# Patient Record
Sex: Male | Born: 1955
Health system: Southern US, Community
[De-identification: ages and names within clinical notes are randomized; demographics above are authoritative.]

## PROBLEM LIST (undated history)

## (undated) DIAGNOSIS — I10 Essential (primary) hypertension: Secondary | ICD-10-CM

---

## 1999-08-10 ENCOUNTER — Emergency Department (HOSPITAL_COMMUNITY): Admission: EM | Admit: 1999-08-10 | Discharge: 1999-08-10 | Payer: Self-pay | Admitting: Emergency Medicine

## 1999-08-10 ENCOUNTER — Encounter: Payer: Self-pay | Admitting: Emergency Medicine

## 2000-03-26 ENCOUNTER — Emergency Department (HOSPITAL_COMMUNITY): Admission: EM | Admit: 2000-03-26 | Discharge: 2000-03-26 | Payer: Self-pay | Admitting: *Deleted

## 2002-03-07 ENCOUNTER — Emergency Department (HOSPITAL_COMMUNITY): Admission: EM | Admit: 2002-03-07 | Discharge: 2002-03-07 | Payer: Self-pay | Admitting: Emergency Medicine

## 2003-07-25 ENCOUNTER — Emergency Department (HOSPITAL_COMMUNITY): Admission: AC | Admit: 2003-07-25 | Discharge: 2003-07-26 | Payer: Self-pay

## 2003-11-25 ENCOUNTER — Emergency Department (HOSPITAL_COMMUNITY): Admission: EM | Admit: 2003-11-25 | Discharge: 2003-11-26 | Payer: Self-pay | Admitting: Emergency Medicine

## 2003-11-26 ENCOUNTER — Emergency Department (HOSPITAL_COMMUNITY): Admission: EM | Admit: 2003-11-26 | Discharge: 2003-11-26 | Payer: Self-pay | Admitting: Emergency Medicine

## 2007-03-06 ENCOUNTER — Emergency Department (HOSPITAL_COMMUNITY): Admission: EM | Admit: 2007-03-06 | Discharge: 2007-03-06 | Payer: Self-pay | Admitting: Emergency Medicine

## 2007-03-07 ENCOUNTER — Emergency Department (HOSPITAL_COMMUNITY): Admission: EM | Admit: 2007-03-07 | Discharge: 2007-03-07 | Payer: Self-pay | Admitting: Emergency Medicine

## 2007-03-11 ENCOUNTER — Emergency Department (HOSPITAL_COMMUNITY): Admission: EM | Admit: 2007-03-11 | Discharge: 2007-03-11 | Payer: Self-pay | Admitting: Emergency Medicine

## 2007-03-30 ENCOUNTER — Emergency Department (HOSPITAL_COMMUNITY): Admission: EM | Admit: 2007-03-30 | Discharge: 2007-03-30 | Payer: Self-pay | Admitting: Emergency Medicine

## 2007-08-31 ENCOUNTER — Emergency Department (HOSPITAL_COMMUNITY): Admission: EM | Admit: 2007-08-31 | Discharge: 2007-08-31 | Payer: Self-pay | Admitting: Emergency Medicine

## 2007-12-07 ENCOUNTER — Emergency Department (HOSPITAL_COMMUNITY): Admission: EM | Admit: 2007-12-07 | Discharge: 2007-12-07 | Payer: Self-pay | Admitting: Emergency Medicine

## 2007-12-27 ENCOUNTER — Emergency Department (HOSPITAL_COMMUNITY): Admission: EM | Admit: 2007-12-27 | Discharge: 2007-12-27 | Payer: Self-pay | Admitting: Emergency Medicine

## 2007-12-30 ENCOUNTER — Emergency Department (HOSPITAL_COMMUNITY): Admission: EM | Admit: 2007-12-30 | Discharge: 2007-12-30 | Payer: Self-pay | Admitting: Emergency Medicine

## 2010-11-20 LAB — URINALYSIS, ROUTINE W REFLEX MICROSCOPIC
Bilirubin Urine: NEGATIVE
Hgb urine dipstick: NEGATIVE
Nitrite: NEGATIVE
Protein, ur: NEGATIVE
pH: 5.5

## 2010-11-20 LAB — COMPREHENSIVE METABOLIC PANEL
AST: 43 — ABNORMAL HIGH
CO2: 23
Calcium: 9.3
Chloride: 101
Creatinine, Ser: 0.97
Glucose, Bld: 107 — ABNORMAL HIGH
Potassium: 3.5
Total Protein: 7.3

## 2010-11-20 LAB — DIFFERENTIAL
Eosinophils Absolute: 0
Eosinophils Relative: 0
Lymphocytes Relative: 10 — ABNORMAL LOW
Lymphs Abs: 0.9
Monocytes Relative: 8
Neutro Abs: 7

## 2010-11-20 LAB — CBC
HCT: 43
Hemoglobin: 14.5
RBC: 4.57

## 2010-11-20 LAB — ETHANOL: Alcohol, Ethyl (B): <5

## 2010-11-20 LAB — PROTIME-INR: Prothrombin Time: 12.6

## 2014-07-21 ENCOUNTER — Emergency Department (HOSPITAL_COMMUNITY)
Admission: EM | Admit: 2014-07-21 | Discharge: 2014-07-22 | Disposition: A | Discharge status: Home / Self Care | Payer: Self-pay | Attending: Emergency Medicine | Admitting: Emergency Medicine

## 2014-07-21 ENCOUNTER — Emergency Department (HOSPITAL_COMMUNITY): Payer: Self-pay

## 2014-07-21 ENCOUNTER — Encounter (HOSPITAL_COMMUNITY): Payer: Self-pay | Admitting: Emergency Medicine

## 2014-07-21 DIAGNOSIS — Y998 Other external cause status: Secondary | ICD-10-CM | POA: Insufficient documentation

## 2014-07-21 DIAGNOSIS — S0990XA Unspecified injury of head, initial encounter: Secondary | ICD-10-CM | POA: Insufficient documentation

## 2014-07-21 DIAGNOSIS — S99911A Unspecified injury of right ankle, initial encounter: Secondary | ICD-10-CM | POA: Insufficient documentation

## 2014-07-21 DIAGNOSIS — Y9389 Activity, other specified: Secondary | ICD-10-CM | POA: Insufficient documentation

## 2014-07-21 DIAGNOSIS — Y9289 Other specified places as the place of occurrence of the external cause: Secondary | ICD-10-CM | POA: Insufficient documentation

## 2014-07-21 DIAGNOSIS — Z720 Tobacco use: Secondary | ICD-10-CM | POA: Insufficient documentation
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
## 2014-07-21 NOTE — ED Notes (Signed)
Returned from CT, [facility] remain at bedside
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
## 2014-07-21 NOTE — ED Notes (Signed)
Bed: WA16 Expected date:  Expected time:  Means of arrival:  Comments: [employer] 

## 2014-07-21 NOTE — ED Notes (Signed)
MD at bedside. 

## 2014-07-22 NOTE — ED Notes (Signed)
Patient laying on stretcher, talking on cell phone.

## 2014-07-22 NOTE — ED Notes (Signed)
Patient ambulatory to bathroom with no assistance.

## 2014-07-22 NOTE — Discharge Instructions (Signed)
Assault, General °Assault includes any behavior, whether intentional or reckless, which results in bodily injury to another person and/or damage to property. Included in this would be any behavior, intentional or reckless, that by its nature would be understood (interpreted) by a reasonable person as intent to harm another person or to damage his/her property. Threats may be oral or written. They may be communicated through regular mail, computer, fax, or phone. These threats may be direct or implied. °FORMS OF ASSAULT INCLUDE: °· Physically assaulting a person. This includes physical threats to inflict physical harm as well as: °· Slapping. °· Hitting. °· Poking. °· Kicking. °· Punching. °· Pushing. °· Arson. °· Sabotage. °· Equipment vandalism. °· Damaging or destroying property. °· Throwing or hitting objects. °· Displaying a weapon or an object that appears to be a weapon in a threatening manner. °· Carrying a firearm of any kind. °· Using a weapon to harm someone. °· Using greater physical size/strength to intimidate another. °· Making intimidating or threatening gestures. °· Bullying. °· Hazing. °· Intimidating, threatening, hostile, or abusive language directed toward another person. °· It communicates the intention to engage in violence against that person. And it leads a reasonable person to expect that violent behavior may occur. °· Stalking another person. °IF IT HAPPENS AGAIN: °· Immediately call for emergency help (911 in [place] °· If someone poses clear and immediate danger to you, seek legal authorities to have a protective or restraining order put in place. °· Less threatening assaults can at least be reported to authorities. °STEPS TO TAKE IF A SEXUAL ASSAULT HAS HAPPENED °· Go to an area of safety. This may include a shelter or staying with a friend. Stay away from the area where you have been attacked. A large percentage of sexual assaults are caused by a friend, relative or associate. °· If  medications were given by your caregiver, take them as directed for the full length of time prescribed. °· Only take over-the-counter or prescription medicines for pain, discomfort, or fever as directed by your caregiver. °· If you have come in contact with a sexual disease, find out if you are to be tested again. If your caregiver is concerned about the HIV/AIDS virus, he/she may require you to have continued testing for several months. °· For the protection of your privacy, test results can not be given over the phone. Make sure you receive the results of your test. If your test results are not back during your visit, make an appointment with your caregiver to find out the results. Do not assume everything is normal if you have not heard from your caregiver or the medical facility. It is important for you to follow up on all of your test results. °· File appropriate papers with authorities. This is important in all assaults, even if it has occurred in a family or by a friend. °SEEK MEDICAL CARE IF: °· You have new problems because of your injuries. °· You have problems that may be because of the medicine you are taking, such as: °· Rash. °· Itching. °· Swelling. °· Trouble breathing. °· You develop belly (abdominal) pain, feel sick to your stomach (nausea) or are vomiting. °· You begin to run a temperature. °· You need supportive care or referral to a rape crisis center. These are centers with trained personnel who can help you get through this ordeal. °SEEK IMMEDIATE MEDICAL CARE IF: °· You are afraid of being threatened, beaten, or abused. In [place] call 911. °· You   receive new injuries related to abuse. °· You develop severe pain in any area injured in the assault or have any change in your condition that concerns you. °· You faint or lose consciousness. °· You develop chest pain or shortness of breath. °Document Released: 02/05/2005 Document Revised: 04/30/2011 Document Reviewed: 09/24/2007 °ExitCare® Patient  Information ©2015 [employer]. This information is not intended to replace advice given to you by your health care provider. Make sure you discuss any questions you have with your health care provider. ° °Head Injury °You have received a head injury. It does not appear serious at this time. Headaches and vomiting are common following head injury. It should be easy to awaken from sleeping. Sometimes it is necessary for you to stay in the emergency department for a while for observation. Sometimes admission to the hospital may be needed. After injuries such as yours, most problems occur within the first 24 hours, but side effects may occur up to 7-10 days after the injury. It is important for you to carefully monitor your condition and contact your health care provider or seek immediate medical care if there is a change in your condition. °WHAT ARE THE TYPES OF HEAD INJURIES? °Head injuries can be as minor as a bump. Some head injuries can be more severe. More severe head injuries include: °· A jarring injury to the brain (concussion). °· A bruise of the brain (contusion). This mean there is bleeding in the brain that can cause swelling. °· A cracked skull (skull fracture). °· Bleeding in the brain that collects, clots, and forms a bump (hematoma). °WHAT CAUSES A HEAD INJURY? °A serious head injury is most likely to happen to someone who is in a car wreck and is not wearing a seat belt. Other causes of major head injuries include bicycle or motorcycle accidents, sports injuries, and falls. °HOW ARE HEAD INJURIES DIAGNOSED? °A complete history of the event leading to the injury and your current symptoms will be helpful in diagnosing head injuries. Many times, pictures of the brain, such as CT or MRI are needed to see the extent of the injury. Often, an overnight hospital stay is necessary for observation.  °WHEN SHOULD I SEEK IMMEDIATE MEDICAL CARE?  °You should get help right away if: °· You have confusion or  drowsiness. °· You feel sick to your stomach (nauseous) or have continued, forceful vomiting. °· You have dizziness or unsteadiness that is getting worse. °· You have severe, continued headaches not relieved by medicine. Only take over-the-counter or prescription medicines for pain, fever, or discomfort as directed by your health care provider. °· You do not have normal function of the arms or legs or are unable to walk. °· You notice changes in the black spots in the center of the colored part of your eye (pupil). °· You have a clear or bloody fluid coming from your nose or ears. °· You have a loss of vision. °During the next 24 hours after the injury, you must stay with someone who can watch you for the warning signs. This person should contact local emergency services (911 in the [place] if you have seizures, you become unconscious, or you are unable to wake up. °HOW CAN I PREVENT A HEAD INJURY IN THE FUTURE? °The most important factor for preventing major head injuries is avoiding motor vehicle accidents.  To minimize the potential for damage to your head, it is crucial to wear seat belts while riding in motor vehicles. Wearing helmets while bike riding   and playing collision sports (like football) is also helpful. Also, avoiding dangerous activities around the house will further help reduce your risk of head injury.  °WHEN CAN I RETURN TO NORMAL ACTIVITIES AND ATHLETICS? °You should be reevaluated by your health care provider before returning to these activities. If you have any of the following symptoms, you should not return to activities or contact sports until 1 week after the symptoms have stopped: °· Persistent headache. °· Dizziness or vertigo. °· Poor attention and concentration. °· Confusion. °· Memory problems. °· Nausea or vomiting. °· Fatigue or tire easily. °· Irritability. °· Intolerant of bright lights or loud noises. °· Anxiety or depression. °· Disturbed sleep. °MAKE SURE YOU:  °· Understand these  instructions. °· Will watch your condition. °· Will get help right away if you are not doing well or get worse. °Document Released: 02/05/2005 Document Revised: 02/10/2013 Document Reviewed: 10/13/2012 °ExitCare® Patient Information ©2015 [employer]. This information is not intended to replace advice given to you by your health care provider. Make sure you discuss any questions you have with your health care provider. ° °

## 2014-07-22 NOTE — ED Notes (Signed)
MD at bedside. 

## 2014-08-22 ENCOUNTER — Emergency Department (HOSPITAL_COMMUNITY)
Admission: EM | Admit: 2014-08-22 | Discharge: 2014-08-23 | Payer: Self-pay | Attending: Emergency Medicine | Admitting: Emergency Medicine

## 2014-08-22 ENCOUNTER — Encounter (HOSPITAL_COMMUNITY): Payer: Self-pay | Admitting: Oncology

## 2014-08-22 DIAGNOSIS — R410 Disorientation, unspecified: Secondary | ICD-10-CM | POA: Insufficient documentation

## 2014-08-22 DIAGNOSIS — R600 Localized edema: Secondary | ICD-10-CM | POA: Insufficient documentation

## 2014-08-22 DIAGNOSIS — Z720 Tobacco use: Secondary | ICD-10-CM | POA: Insufficient documentation

## 2014-08-22 NOTE — ED Notes (Signed)
Per paperwork from jail pt needs to be seen d/t edema, confusion and jaundice.

## 2014-08-22 NOTE — ED Provider Notes (Signed)
CSN: [id number]     Arrival date & time 08/22/14  2322 History   First MD Initiated Contact with Patient 08/22/14 2324     Chief Complaint  Patient presents with  . Jaundice    edema, confusion     (Consider location/radiation/quality/duration/timing/severity/associated sxs/prior Treatment) HPI Comments: Patient with past history of alcoholism presents from jail with complaints of new lower extremity edema, confusion, jaundice. History provided by paperwork sent with patient. Patient completed an alcohol detox protocol in early June. No other background given. Patient currently denies medical complaints. He denies chest pain or shortness of breath. No fever, nausea or vomiting. No abdominal pain or diarrhea. The onset of this condition was acute. The course is constant. Aggravating factors: none. Alleviating factors: none.    The history is provided by the patient and medical records.    History reviewed. No pertinent past medical history. History reviewed. No pertinent past surgical history. No family history on file. History  Substance Use Topics  . Smoking status: Current Every Day Smoker -- 0.50 packs/day    Types: Cigarettes  . Smokeless tobacco: Not on file  . Alcohol Use: Yes    Review of Systems  Constitutional: Negative for fever.  HENT: Negative for rhinorrhea and sore throat.   Eyes: Negative for redness.  Respiratory: Negative for cough.   Cardiovascular: Positive for leg swelling. Negative for chest pain.  Gastrointestinal: Negative for nausea, vomiting, abdominal pain and diarrhea.  Genitourinary: Negative for dysuria.  Musculoskeletal: Negative for myalgias.  Skin: Positive for color change. Negative for rash.  Neurological: Negative for headaches.  Psychiatric/Behavioral: Positive for confusion (reported, patient currently appropriate).      Allergies  Review of patient's allergies indicates no known allergies.  Home Medications   Prior to Admission  medications   Not on File   BP 162/92 mmHg  Pulse 86  Temp(Src) 98.7 F (37.1 C) (Oral)  Resp 16  Ht  (1.778 m)  Wt 150 lb (68.04 kg)  BMI 21.52 kg/m2  SpO2 99%   Physical Exam  Constitutional: He appears well-developed and well-nourished.  HENT:  Head: Normocephalic and atraumatic.  Mouth/Throat: Mucous membranes are normal. Mucous membranes are not dry.  Eyes: Conjunctivae are normal. Right eye exhibits no discharge. Left eye exhibits no discharge.  Neck: Trachea normal and normal range of motion. Neck supple. Normal carotid pulses and no JVD present. No muscular tenderness present. Carotid bruit is not present. No tracheal deviation present.  Cardiovascular: Normal rate, regular rhythm, S1 normal, S2 normal, normal heart sounds and intact distal pulses.  Exam reveals no distant heart sounds and no decreased pulses.   No murmur heard. Pulmonary/Chest: Effort normal and breath sounds normal. No respiratory distress. He has no wheezes. He exhibits no tenderness.  Abdominal: Soft. Normal aorta and bowel sounds are normal. There is no tenderness. There is no rebound and no guarding.  Musculoskeletal: He exhibits edema and tenderness.  2+ pitting edema to knees bilaterally.   Neurological: He is alert.  Skin: Skin is warm and dry. He is not diaphoretic. No cyanosis. No pallor.  Psychiatric: He has a normal mood and affect.  Nursing note and vitals reviewed.   ED Course  Procedures (including critical care time) Labs Review Labs Reviewed  CBC WITH DIFFERENTIAL/PLATELET - Abnormal; Notable for the following:    RBC 4.18 (*)    Hemoglobin 12.4 (*)    HCT 37.6 (*)    All other components within normal limits  BASIC METABOLIC  PANEL - Abnormal; Notable for the following:    Glucose, Bld 107 (*)    All other components within normal limits  HEPATIC FUNCTION PANEL  AMMONIA  PROTIME-INR  URINALYSIS, DIPSTICK ONLY    Imaging Review No results found.   EKG  Interpretation   Date/Time:  Monday August 23 2014 00:19:08 EDT Ventricular Rate:  77 PR Interval:  168 QRS Duration: 93 QT Interval:  427 QTC Calculation: 483 R Axis:   9 Text Interpretation:  Sinus rhythm Confirmed by [facility]-RASCH  MD, APRIL  ([id number]) on 08/23/2014 12:35:55 AM      11:51 PM Patient seen and examined. Work-up initiated. Medications ordered.   Vital signs reviewed and are as follows: BP 162/92 mmHg  Pulse 86  Temp(Src) 98.7 F (37.1 C) (Oral)  Resp 16  Ht 5\' 10"  (1.778 m)  Wt 150 lb (68.04 kg)  BMI 21.52 kg/m2  SpO2 99%  1:10 AM Patient labs reviewed with Dr. Nicanor AlconPalumbo. Patient is not jaundiced. No signs of hepatic encephalopathy. EKG normal. No indications for admission. Will discharge.   Patient urged to return with worsening symptoms or other concerns. Patient verbalized understanding and agrees with plan.    MDM   Final diagnoses:  Bilateral edema of lower extremity   Patient from jail with 3 complaints, limited history sent with patient:   Lower extremity edema: No signs of CHF, liver failure, kidney dysfunction/nephrotic syndrome  Altered mentation: alcohol detox completed 3-4 weeks ago and do not suspect DTs, patient is alert and properly oriented here, normal ammonia. No signs of stroke, moves all extremities.   Jaundice: normal bili  Vitals and patient exam stable during ED stay.   No dangerous or life-threatening conditions suspected or identified by history, physical exam, and by work-up. No indications for hospitalization identified.      Renne CriglerJoshua Elecia Serafin, PA-C 08/23/14 0117  April Palumbo, MD 08/23/14 [phone]

## 2014-08-23 LAB — CBC WITH DIFFERENTIAL/PLATELET
BASOS ABS: 0 10*3/uL (ref 0.0–0.1)
BASOS PCT: 0 % (ref 0–1)
Eosinophils Absolute: 0.3 10*3/uL (ref 0.0–0.7)
Eosinophils Relative: 5 % (ref 0–5)
HEMATOCRIT: 37.6 % — AB (ref 39.0–52.0)
HEMOGLOBIN: 12.4 g/dL — AB (ref 13.0–17.0)
LYMPHS PCT: 39 % (ref 12–46)
Lymphs Abs: 2.4 10*3/uL (ref 0.7–4.0)
MCH: 29.7 pg (ref 26.0–34.0)
MCHC: 33 g/dL (ref 30.0–36.0)
MCV: 90 fL (ref 78.0–100.0)
Monocytes Absolute: 0.7 10*3/uL (ref 0.1–1.0)
Monocytes Relative: 11 % (ref 3–12)
NEUTROS ABS: 2.8 10*3/uL (ref 1.7–7.7)
Neutrophils Relative %: 45 % (ref 43–77)
PLATELETS: 300 10*3/uL (ref 150–400)
RBC: 4.18 MIL/uL — ABNORMAL LOW (ref 4.22–5.81)
RDW: 12 % (ref 11.5–15.5)
WBC: 6.3 10*3/uL (ref 4.0–10.5)

## 2014-08-23 LAB — HEPATIC FUNCTION PANEL
ALK PHOS: 51 U/L (ref 38–126)
ALT: 24 U/L (ref 17–63)
AST: 26 U/L (ref 15–41)
Albumin: 4.2 g/dL (ref 3.5–5.0)
BILIRUBIN DIRECT: 0.1 mg/dL (ref 0.1–0.5)
BILIRUBIN INDIRECT: 0.4 mg/dL (ref 0.3–0.9)
BILIRUBIN TOTAL: 0.5 mg/dL (ref 0.3–1.2)
Total Protein: 8.1 g/dL (ref 6.5–8.1)

## 2014-08-23 LAB — BASIC METABOLIC PANEL
Anion gap: 8 (ref 5–15)
BUN: 14 mg/dL (ref 6–20)
CHLORIDE: 106 mmol/L (ref 101–111)
CO2: 29 mmol/L (ref 22–32)
CREATININE: 0.97 mg/dL (ref 0.61–1.24)
Calcium: 10 mg/dL (ref 8.9–10.3)
GLUCOSE: 107 mg/dL — AB (ref 65–99)
POTASSIUM: 3.9 mmol/L (ref 3.5–5.1)
SODIUM: 143 mmol/L (ref 135–145)

## 2014-08-23 LAB — AMMONIA: AMMONIA: 27 umol/L (ref 9–35)

## 2014-08-23 LAB — PROTIME-INR
INR: 1.03 (ref 0.00–1.49)
Prothrombin Time: 13.7 seconds (ref 11.6–15.2)

## 2014-08-23 NOTE — ED Notes (Signed)
EKG given to EDP Palumbo for review 

## 2014-08-23 NOTE — Discharge Instructions (Signed)
Please read and follow all provided instructions.  Your diagnoses today include:  1. Bilateral edema of lower extremity     Tests performed today include:  Blood counts and electrolytes - normal  Liver function - normal  Ammonia - normal  EKG - normal  Vital signs. See below for your results today.   Medications prescribed:   None  Take any prescribed medications only as directed.  Home care instructions:  Follow any educational materials contained in this packet.  BE VERY CAREFUL not to take multiple medicines containing Tylenol (also called acetaminophen). Doing so can lead to an overdose which can damage your liver and cause liver failure and possibly death.   Follow-up instructions: Please follow-up with your primary care provider in the next 3 days for further evaluation of your symptoms.   Return instructions:   Please return to the Emergency Department if you experience worsening symptoms.   Please return if you have any other emergent concerns.  Additional Information:  Your vital signs today were: BP 171/91 mmHg   Pulse 78   Temp(Src) 98.7 F (37.1 C) (Oral)   Resp 14   Ht  (1.778 m)   Wt 150 lb (68.04 kg)   BMI 21.52 kg/m2   SpO2 100% If your blood pressure (BP) was elevated above 135/85 this visit, please have this repeated by your doctor within one month. --------------

## 2015-10-11 ENCOUNTER — Emergency Department (HOSPITAL_COMMUNITY)
Admission: EM | Admit: 2015-10-11 | Discharge: 2015-10-11 | Discharge status: Against Medical Advice | Payer: Self-pay | Attending: Emergency Medicine | Admitting: Emergency Medicine

## 2015-10-11 ENCOUNTER — Ambulatory Visit (HOSPITAL_COMMUNITY): Admission: RE | Admit: 2015-10-11 | Payer: Self-pay | Source: Ambulatory Visit

## 2015-10-11 ENCOUNTER — Encounter (HOSPITAL_COMMUNITY): Payer: Self-pay | Admitting: *Deleted

## 2015-10-11 ENCOUNTER — Emergency Department (HOSPITAL_COMMUNITY): Payer: Self-pay

## 2015-10-11 ENCOUNTER — Encounter (HOSPITAL_COMMUNITY): Payer: Self-pay

## 2015-10-11 ENCOUNTER — Emergency Department (HOSPITAL_COMMUNITY)
Admission: EM | Admit: 2015-10-11 | Discharge: 2015-10-11 | Disposition: A | Discharge status: Home / Self Care | Payer: Self-pay | Attending: Dermatology | Admitting: Dermatology

## 2015-10-11 DIAGNOSIS — Y939 Activity, unspecified: Secondary | ICD-10-CM | POA: Insufficient documentation

## 2015-10-11 DIAGNOSIS — Y999 Unspecified external cause status: Secondary | ICD-10-CM | POA: Insufficient documentation

## 2015-10-11 DIAGNOSIS — S0191XA Laceration without foreign body of unspecified part of head, initial encounter: Secondary | ICD-10-CM

## 2015-10-11 DIAGNOSIS — R220 Localized swelling, mass and lump, head: Secondary | ICD-10-CM | POA: Insufficient documentation

## 2015-10-11 DIAGNOSIS — S060X1A Concussion with loss of consciousness of 30 minutes or less, initial encounter: Secondary | ICD-10-CM | POA: Insufficient documentation

## 2015-10-11 DIAGNOSIS — S0990XA Unspecified injury of head, initial encounter: Secondary | ICD-10-CM

## 2015-10-11 DIAGNOSIS — F17210 Nicotine dependence, cigarettes, uncomplicated: Secondary | ICD-10-CM | POA: Insufficient documentation

## 2015-10-11 DIAGNOSIS — R0781 Pleurodynia: Secondary | ICD-10-CM | POA: Insufficient documentation

## 2015-10-11 DIAGNOSIS — I10 Essential (primary) hypertension: Secondary | ICD-10-CM | POA: Insufficient documentation

## 2015-10-11 DIAGNOSIS — Z5321 Procedure and treatment not carried out due to patient leaving prior to being seen by health care provider: Secondary | ICD-10-CM | POA: Insufficient documentation

## 2015-10-11 DIAGNOSIS — Y929 Unspecified place or not applicable: Secondary | ICD-10-CM | POA: Insufficient documentation

## 2015-10-11 DIAGNOSIS — S0181XA Laceration without foreign body of other part of head, initial encounter: Secondary | ICD-10-CM | POA: Insufficient documentation

## 2015-10-11 HISTORY — DX: Essential (primary) hypertension: I10

## 2015-10-11 MED ORDER — LIDOCAINE HCL (PF) 1 % IJ SOLN
INTRAMUSCULAR | Status: AC
  Filled 2015-10-11: qty 30

## 2015-10-11 MED ORDER — LIDOCAINE-EPINEPHRINE 1 %-1:100000 IJ SOLN: 10.0000 mL | Freq: Once | INTRAMUSCULAR | Status: AC

## 2015-10-11 MED ORDER — LIDOCAINE-EPINEPHRINE 2 %-1:100000 IJ SOLN
INTRAMUSCULAR | Status: AC
Start: 2015-10-11 — End: 2015-10-11
  Administered 2015-10-11: 04:00:00
  Filled 2015-10-11: qty 1

## 2015-10-11 MED ORDER — TETANUS-DIPHTH-ACELL PERTUSSIS 5-2.5-18.5 LF-MCG/0.5 IM SUSP
0.5000 mL | Freq: Once | INTRAMUSCULAR | Status: DC
  Filled 2015-10-11: qty 0.5

## 2015-10-11 NOTE — ED Triage Notes (Signed)
Pt admits to drinking two 40oz beers tonight.
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
## 2015-10-11 NOTE — ED Notes (Signed)
Pt refused TDAP vaccine  tdap returned cabinet    Lidocaine with epi used by provider unable to scan was thrown away after use  Lidocaine 1percent returned to cabinet   Pt left without D/c paper work and refused CT of head
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
## 2015-10-11 NOTE — ED Notes (Signed)
Pt complaining of some L lower rib pain.
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
## 2016-07-10 ENCOUNTER — Emergency Department (HOSPITAL_COMMUNITY)
Admission: EM | Admit: 2016-07-10 | Discharge: 2016-07-10 | Disposition: A | Discharge status: Home / Self Care | Payer: Self-pay | Attending: Emergency Medicine | Admitting: Emergency Medicine

## 2016-07-10 ENCOUNTER — Encounter (HOSPITAL_COMMUNITY): Payer: Self-pay

## 2016-07-10 ENCOUNTER — Emergency Department (HOSPITAL_COMMUNITY): Payer: Self-pay

## 2016-07-10 DIAGNOSIS — F17210 Nicotine dependence, cigarettes, uncomplicated: Secondary | ICD-10-CM | POA: Insufficient documentation

## 2016-07-10 DIAGNOSIS — S0230XA Fracture of orbital floor, unspecified side, initial encounter for closed fracture: Secondary | ICD-10-CM

## 2016-07-10 DIAGNOSIS — H11422 Conjunctival edema, left eye: Secondary | ICD-10-CM

## 2016-07-10 DIAGNOSIS — S0232XA Fracture of orbital floor, left side, initial encounter for closed fracture: Secondary | ICD-10-CM | POA: Insufficient documentation

## 2016-07-10 DIAGNOSIS — H35102 Retinopathy of prematurity, unspecified, left eye: Secondary | ICD-10-CM | POA: Insufficient documentation

## 2016-07-10 DIAGNOSIS — Y939 Activity, unspecified: Secondary | ICD-10-CM | POA: Insufficient documentation

## 2016-07-10 DIAGNOSIS — R931 Abnormal findings on diagnostic imaging of heart and coronary circulation: Secondary | ICD-10-CM | POA: Insufficient documentation

## 2016-07-10 DIAGNOSIS — S058X2A Other injuries of left eye and orbit, initial encounter: Secondary | ICD-10-CM

## 2016-07-10 DIAGNOSIS — Y999 Unspecified external cause status: Secondary | ICD-10-CM | POA: Insufficient documentation

## 2016-07-10 DIAGNOSIS — S0083XA Contusion of other part of head, initial encounter: Secondary | ICD-10-CM | POA: Insufficient documentation

## 2016-07-10 DIAGNOSIS — Y929 Unspecified place or not applicable: Secondary | ICD-10-CM | POA: Insufficient documentation

## 2016-07-10 DIAGNOSIS — H11429 Conjunctival edema, unspecified eye: Secondary | ICD-10-CM | POA: Insufficient documentation

## 2016-07-10 DIAGNOSIS — T07XXXA Unspecified multiple injuries, initial encounter: Secondary | ICD-10-CM

## 2016-07-10 DIAGNOSIS — H3500 Unspecified background retinopathy: Secondary | ICD-10-CM

## 2016-07-10 DIAGNOSIS — R935 Abnormal findings on diagnostic imaging of other abdominal regions, including retroperitoneum: Secondary | ICD-10-CM | POA: Insufficient documentation

## 2016-07-10 LAB — I-STAT CHEM 8, ED
BUN: 12 mg/dL (ref 6–20)
CHLORIDE: 107 mmol/L (ref 101–111)
Calcium, Ion: 1.2 mmol/L (ref 1.15–1.40)
Creatinine, Ser: 1.4 mg/dL — ABNORMAL HIGH (ref 0.61–1.24)
GLUCOSE: 114 mg/dL — AB (ref 65–99)
HCT: 47 % (ref 39.0–52.0)
Hemoglobin: 16 g/dL (ref 13.0–17.0)
POTASSIUM: 4.1 mmol/L (ref 3.5–5.1)
Sodium: 138 mmol/L (ref 135–145)
TCO2: 21 mmol/L (ref 0–100)

## 2016-07-10 LAB — CBC WITH DIFFERENTIAL/PLATELET
BASOS ABS: 0 10*3/uL (ref 0.0–0.1)
Basophils Relative: 0 %
Eosinophils Absolute: 0 10*3/uL (ref 0.0–0.7)
Eosinophils Relative: 0 %
HEMATOCRIT: 43.6 % (ref 39.0–52.0)
Hemoglobin: 15 g/dL (ref 13.0–17.0)
Lymphocytes Relative: 51 %
Lymphs Abs: 3.7 10*3/uL (ref 0.7–4.0)
MCH: 31.8 pg (ref 26.0–34.0)
MCHC: 34.4 g/dL (ref 30.0–36.0)
MCV: 92.6 fL (ref 78.0–100.0)
MONO ABS: 0.4 10*3/uL (ref 0.1–1.0)
Monocytes Relative: 5 %
NEUTROS ABS: 3.2 10*3/uL (ref 1.7–7.7)
Neutrophils Relative %: 44 %
Platelets: 290 10*3/uL (ref 150–400)
RBC: 4.71 MIL/uL (ref 4.22–5.81)
RDW: 12.4 % (ref 11.5–15.5)
WBC: 7.3 10*3/uL (ref 4.0–10.5)

## 2016-07-10 MED ORDER — FENTANYL CITRATE (PF) 100 MCG/2ML IJ SOLN
INTRAMUSCULAR | Status: AC
  Administered 2016-07-10: 100 ug
  Filled 2016-07-10: qty 2

## 2016-07-10 MED ORDER — METHOCARBAMOL 500 MG PO TABS
1000.0000 mg | ORAL_TABLET | Freq: Once | ORAL | Status: AC
  Administered 2016-07-10: 1000 mg via ORAL
  Filled 2016-07-10: qty 2

## 2016-07-10 MED ORDER — FENTANYL CITRATE (PF) 100 MCG/2ML IJ SOLN
100.0000 ug | Freq: Once | INTRAMUSCULAR | Status: AC
  Administered 2016-07-10: 100 ug via INTRAVENOUS
  Filled 2016-07-10: qty 2

## 2016-07-10 MED ORDER — TOBRAMYCIN 0.3 % OP OINT: 1.0000 "application " | TOPICAL_OINTMENT | Freq: Three times a day (TID) | OPHTHALMIC | 0 refills | Status: DC

## 2016-07-10 MED ORDER — LIDOCAINE HCL 2 % IJ SOLN
INTRAMUSCULAR | Status: AC
  Filled 2016-07-10: qty 20

## 2016-07-10 MED ORDER — CLINDAMYCIN PHOSPHATE 600 MG/50ML IV SOLN
600.0000 mg | Freq: Once | INTRAVENOUS | Status: AC
  Administered 2016-07-10: 600 mg via INTRAVENOUS
  Filled 2016-07-10: qty 50

## 2016-07-10 MED ORDER — OXYCODONE-ACETAMINOPHEN 5-325 MG PO TABS
1.0000 | ORAL_TABLET | Freq: Once | ORAL | Status: AC
  Administered 2016-07-10: 1 via ORAL
  Filled 2016-07-10: qty 1

## 2016-07-10 MED ORDER — CLINDAMYCIN HCL 300 MG PO CAPS: 300.0000 mg | ORAL_CAPSULE | Freq: Four times a day (QID) | ORAL | 0 refills | Status: DC

## 2016-07-10 MED ORDER — OXYCODONE-ACETAMINOPHEN 5-325 MG PO TABS: 1.0000 | ORAL_TABLET | Freq: Four times a day (QID) | ORAL | 0 refills | Status: DC | PRN

## 2016-07-10 MED ORDER — SODIUM CHLORIDE 0.9 % IV BOLUS (SEPSIS)
1000.0000 mL | Freq: Once | INTRAVENOUS | Status: AC
  Administered 2016-07-10: 1000 mL via INTRAVENOUS

## 2016-07-10 MED ORDER — IOPAMIDOL (ISOVUE-300) INJECTION 61%
100.0000 mL | Freq: Once | INTRAVENOUS | Status: AC | PRN
  Administered 2016-07-10: 100 mL via INTRAVENOUS

## 2016-07-10 MED ORDER — KETOROLAC TROMETHAMINE 30 MG/ML IJ SOLN
30.0000 mg | Freq: Once | INTRAMUSCULAR | Status: AC
  Administered 2016-07-10: 30 mg via INTRAVENOUS
  Filled 2016-07-10: qty 1

## 2016-07-10 MED ORDER — IOPAMIDOL (ISOVUE-300) INJECTION 61%
INTRAVENOUS | Status: AC
  Administered 2016-07-10: 100 mL via INTRAVENOUS
  Filled 2016-07-10: qty 100
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
## 2016-07-10 NOTE — ED Provider Notes (Signed)
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 products present within the left maxillary sinus. Scattered mucosal thickening within the ethmoidal air cells, greater on the left. Polypoid mucosal thickening noted within the inferior right maxillary sinus. Soft tissues: Multifocal left periorbital and facial contusion. CT CERVICAL SPINE FINDINGS Alignment: Study limited by positioning. Mild straightening of the normal cervical lordosis.  No listhesis. Skull base and vertebrae: Skullbase intact. Normal C1-2 articulations preserved. Dens intact. Vertebral body heights maintained. Small linear lucency through the right lateral mass of C2 favored to reflect a small nutrient foramen. No acute fracture. Soft tissues and spinal canal: Visualized soft tissues of the neck demonstrate no acute abnormality. No prevertebral edema. Vascular calcifications noted about the carotid bifurcations. Disc levels:  No significant degenerative changes. Upper chest: Visualized upper chest demonstrates no acute abnormality. No apical pneumothorax. IMPRESSION: CT HEAD: No acute intracranial process identified. CT MAXILLOFACIAL/ORBITS: 1. Acute comminuted left orbital floor fracture with approximately 4 mm of inferior  displacement. Soft tissue stranding/opacity within the lateral and inferior extraconal fat likely reflects a small amount of associated hemorrhage. Intact globe. Deformity at the left lamina papyracea felt to be chronic in nature. 2. Left periorbital/facial soft tissue contusion. CT CERVICAL SPINE: No acute traumatic injury within the cervical spine. Electronically Signed   By: Rise Mu M.D.   On: 07/10/2016 02:06    Procedures Procedures (including critical care time)  Medications Ordered in ED  Medications  lidocaine (XYLOCAINE) 2 % (with pres) injection (not administered)  fentaNYL (SUBLIMAZE) injection 100 mcg (not administered)  fentaNYL (SUBLIMAZE) 100 MCG/2ML injection (100 mcg  Given 07/10/16 0141)  clindamycin (CLEOCIN) IVPB 600 mg (0 mg Intravenous Stopped 07/10/16 0220)  iopamidol (ISOVUE-300) 61 % injection 100 mL (100 mLs Intravenous Contrast Given 07/10/16 0136)  fentaNYL (SUBLIMAZE) injection 100 mcg (100 mcg Intravenous Given 07/10/16 0239)  sodium chloride 0.9 % bolus 1,000 mL (1,000 mLs Intravenous New Bag/Given 07/10/16 0239)      Final Clinical Impressions(s) / ED Diagnoses  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 Francia Greaves follow up in 2 weeks apply ointment to canthotomy site.  See her note Orbital floor fracture:  717 case d/w Dr. Suszanne Conners follow up in the office.   Abrasions:    Return immediately for fever > 101, swelling, changes in thinking, weakness, bleeding or any concerns. RX for oral and eye ointment, pain medications.  Strict return precautions.    The patient is nontoxic-appearing on exam and vital signs are within normal limits.   I have reviewed the triage vital signs and the nursing notes. Pertinent labs &imaging results that were available during my care of the patient were reviewed by me and considered in my medical decision making (see chart for details).  After history, exam, and medical workup I feel the patient has been  appropriately medically screened and is safe for discharge home. Pertinent diagnoses were discussed with the patient. Patient was given return precautions.   I personally performed the services described in this documentation, which was scribed in my presence. The recorded information has been reviewed and is accurate.     Lars Jeziorski, MD 07/10/16 [phone]
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
## 2018-08-13 ENCOUNTER — Emergency Department (HOSPITAL_COMMUNITY)
Admission: EM | Admit: 2018-08-13 | Discharge: 2018-08-13 | Disposition: A | Discharge status: Home / Self Care | Payer: Self-pay | Attending: Emergency Medicine | Admitting: Emergency Medicine

## 2018-08-13 ENCOUNTER — Emergency Department (HOSPITAL_COMMUNITY): Payer: Self-pay

## 2018-08-13 ENCOUNTER — Encounter (HOSPITAL_COMMUNITY): Payer: Self-pay | Admitting: Emergency Medicine

## 2018-08-13 ENCOUNTER — Other Ambulatory Visit: Payer: Self-pay

## 2018-08-13 DIAGNOSIS — M542 Cervicalgia: Secondary | ICD-10-CM | POA: Insufficient documentation

## 2018-08-13 DIAGNOSIS — Z87891 Personal history of nicotine dependence: Secondary | ICD-10-CM | POA: Insufficient documentation

## 2018-08-13 DIAGNOSIS — Y939 Activity, unspecified: Secondary | ICD-10-CM | POA: Insufficient documentation

## 2018-08-13 DIAGNOSIS — Z79899 Other long term (current) drug therapy: Secondary | ICD-10-CM | POA: Insufficient documentation

## 2018-08-13 DIAGNOSIS — Z23 Encounter for immunization: Secondary | ICD-10-CM | POA: Insufficient documentation

## 2018-08-13 DIAGNOSIS — Y929 Unspecified place or not applicable: Secondary | ICD-10-CM | POA: Insufficient documentation

## 2018-08-13 DIAGNOSIS — T148XXA Other injury of unspecified body region, initial encounter: Secondary | ICD-10-CM

## 2018-08-13 DIAGNOSIS — I10 Essential (primary) hypertension: Secondary | ICD-10-CM | POA: Insufficient documentation

## 2018-08-13 DIAGNOSIS — S20212A Contusion of left front wall of thorax, initial encounter: Secondary | ICD-10-CM | POA: Insufficient documentation

## 2018-08-13 DIAGNOSIS — S6991XA Unspecified injury of right wrist, hand and finger(s), initial encounter: Secondary | ICD-10-CM | POA: Insufficient documentation

## 2018-08-13 DIAGNOSIS — Y999 Unspecified external cause status: Secondary | ICD-10-CM | POA: Insufficient documentation

## 2018-08-13 DIAGNOSIS — S80812A Abrasion, left lower leg, initial encounter: Secondary | ICD-10-CM | POA: Insufficient documentation

## 2018-08-13 MED ORDER — TETANUS-DIPHTH-ACELL PERTUSSIS 5-2.5-18.5 LF-MCG/0.5 IM SUSP
0.5000 mL | Freq: Once | INTRAMUSCULAR | Status: AC
Start: 2018-08-13 — End: 2018-08-13
  Administered 2018-08-13: 0.5 mL via INTRAMUSCULAR
  Filled 2018-08-13: qty 0.5

## 2018-08-13 MED ORDER — MELOXICAM 15 MG PO TABS: 15.0000 mg | ORAL_TABLET | Freq: Every day | ORAL | 0 refills | Status: DC

## 2018-08-13 NOTE — ED Notes (Signed)
Patient verbalizes understanding of discharge instructions. Opportunity for questioning and answers were provided. Armband removed by staff, pt discharged from ED home via POV.  
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
## 2018-08-13 NOTE — ED Notes (Signed)
Patient transported to X-ray 

## 2019-06-27 ENCOUNTER — Ambulatory Visit: Payer: Self-pay | Attending: Internal Medicine

## 2019-06-27 DIAGNOSIS — Z23 Encounter for immunization: Secondary | ICD-10-CM

## 2019-06-27 NOTE — Progress Notes (Signed)
   Covid-19 Vaccination Clinic  Name:  Keith Andrews    MRN: [id number] DOB: 07/17/1955  06/27/2019  [name] was observed post Covid-19 immunization for 15 minutes without incident. He was provided with Vaccine Information Sheet and instruction to access the V-Safe system.   [name] was instructed to call 911 with any severe reactions post vaccine: Marland Kitchen Difficulty breathing  . Swelling of face and throat  . A fast heartbeat  . A bad rash all over body  . Dizziness and weakness   Immunizations Administered    Name Date Dose VIS Date Route   Pfizer COVID-19 Vaccine 06/27/2019 10:18 AM 0.3 mL 04/15/2018 Intramuscular   Manufacturer: [employer], [employer]   Lot: [id number]   NDC: [address]-8

## 2019-07-18 ENCOUNTER — Ambulatory Visit: Payer: Self-pay | Attending: Internal Medicine

## 2019-07-18 DIAGNOSIS — Z23 Encounter for immunization: Secondary | ICD-10-CM

## 2019-07-18 NOTE — Progress Notes (Signed)
   Covid-19 Vaccination Clinic  Name:  ZIMRI BRENNEN    MRN: [id number] DOB: May 09, 1955  07/18/2019  Mr. Whiters was observed post Covid-19 immunization for 15 minutes without incident. He was provided with Vaccine Information Sheet and instruction to access the V-Safe system.   Mr. Schaben was instructed to call 911 with any severe reactions post vaccine: Marland Kitchen Difficulty breathing  . Swelling of face and throat  . A fast heartbeat  . A bad rash all over body  . Dizziness and weakness   Immunizations Administered    Name Date Dose VIS Date Route   Pfizer COVID-19 Vaccine 07/18/2019 10:02 AM 0.3 mL 04/15/2018 Intramuscular   Manufacturer: [employer], [employer]   Lot: [id number]   NDC: [address]-8

## 2019-09-13 ENCOUNTER — Emergency Department (HOSPITAL_COMMUNITY)
Admission: EM | Admit: 2019-09-13 | Discharge: 2019-09-13 | Disposition: A | Discharge status: Home / Self Care | Payer: Self-pay | Attending: Emergency Medicine | Admitting: Emergency Medicine

## 2019-09-13 ENCOUNTER — Encounter (HOSPITAL_COMMUNITY): Payer: Self-pay

## 2019-09-13 DIAGNOSIS — I10 Essential (primary) hypertension: Secondary | ICD-10-CM | POA: Insufficient documentation

## 2019-09-13 DIAGNOSIS — B9789 Other viral agents as the cause of diseases classified elsewhere: Secondary | ICD-10-CM | POA: Insufficient documentation

## 2019-09-13 DIAGNOSIS — M25532 Pain in left wrist: Secondary | ICD-10-CM | POA: Insufficient documentation

## 2019-09-13 DIAGNOSIS — M109 Gout, unspecified: Secondary | ICD-10-CM

## 2019-09-13 DIAGNOSIS — J029 Acute pharyngitis, unspecified: Secondary | ICD-10-CM

## 2019-09-13 DIAGNOSIS — F17210 Nicotine dependence, cigarettes, uncomplicated: Secondary | ICD-10-CM | POA: Insufficient documentation

## 2019-09-13 DIAGNOSIS — M1009 Idiopathic gout, multiple sites: Secondary | ICD-10-CM | POA: Insufficient documentation

## 2019-09-13 DIAGNOSIS — J028 Acute pharyngitis due to other specified organisms: Secondary | ICD-10-CM | POA: Insufficient documentation

## 2019-09-13 LAB — GROUP A STREP BY PCR: Group A Strep by PCR: NOT DETECTED

## 2019-09-13 MED ORDER — HYDROCODONE-ACETAMINOPHEN 5-325 MG PO TABS: 1.0000 | ORAL_TABLET | Freq: Four times a day (QID) | ORAL | 0 refills | Status: DC | PRN

## 2019-09-13 MED ORDER — PREDNISONE 10 MG (21) PO TBPK: ORAL_TABLET | Freq: Every day | ORAL | 0 refills | Status: DC

## 2019-09-13 MED ORDER — HYDROCODONE-ACETAMINOPHEN 5-325 MG PO TABS
1.0000 | ORAL_TABLET | Freq: Once | ORAL | Status: AC
  Administered 2019-09-13: 1 via ORAL
  Filled 2019-09-13: qty 1

## 2019-09-13 MED ORDER — PREDNISONE 20 MG PO TABS
60.0000 mg | ORAL_TABLET | Freq: Once | ORAL | Status: AC
Start: 2019-09-13 — End: 2019-09-13
  Administered 2019-09-13: 60 mg via ORAL
  Filled 2019-09-13: qty 3

## 2019-09-13 MED ORDER — LIDOCAINE VISCOUS HCL 2 % MT SOLN: 15.0000 mL | OROMUCOSAL | 0 refills | Status: DC | PRN

## 2019-09-13 NOTE — ED Triage Notes (Signed)
Pt presents with c/o bilateral leg and feet swelling. Per EMS, they do not notice any swelling that they can see. Pt has a hx of gout.

## 2019-09-13 NOTE — ED Provider Notes (Signed)
[facility]-EMERGENCY DEPT Provider Note   CSN: [id number] Arrival date & time: 09/13/19  1005     History Chief Complaint  Patient presents with  . Leg Pain  . Sore Throat    Keith Andrews is a 64 y.o. male with a past medical history of hypertension, gout presenting to the ED with a chief complaint of joint pain and sore throat.  States that he woke up this morning with a sore throat that is worse with swallowing.  He denies any sick contacts with similar symptoms.  Denies any cough, congestion, rhinorrhea, neck stiffness. Also reports pain in his left wrist, bilateral ankles and feet.  He is concerned that this is related to gout as he reports similar pain in the past with improvement with gout medication.  He did take a "purple dot pill" that he got from his friend when symptoms first began with significant improvement.  He states that when he does get a gout flareup that it will be in multiple joints similar to this.  He denies any injuries or falls, history of infected joint, history of DVT or PE, numbness, weakness, changes to gait, fever.  HPI     Past Medical History:  Diagnosis Date  . Hypertension     There are no problems to display for this patient.   History reviewed. No pertinent surgical history.     History reviewed. No pertinent family history.  Social History   Tobacco Use  . Smoking status: Current Every Day Smoker    Packs/day: 0.50    Types: Cigarettes  . Smokeless tobacco: Never Used  Substance Use Topics  . Alcohol use: Yes  . Drug use: Not on file    Home Medications Prior to Admission medications   Medication Sig Start Date End Date Taking? Authorizing Provider  clindamycin (CLEOCIN) 300 MG capsule Take 1 capsule (300 mg total) by mouth 4 (four) times daily. X 7 days 07/10/16   Nicanor Alcon, April, MD  HYDROcodone-acetaminophen (NORCO/VICODIN) 5-325 MG tablet Take 1 tablet by mouth every 6 (six) hours as needed. 09/13/19    Cyanna Neace, PA-C  lidocaine (XYLOCAINE) 2 % solution Use as directed 15 mLs in the mouth or throat as needed for mouth pain. 09/13/19   Kiona Blume, PA-C  meloxicam (MOBIC) 15 MG tablet Take 1 tablet (15 mg total) by mouth daily. Take 1 daily with food. 08/13/18   Arthor Captain, PA-C  oxyCODONE-acetaminophen (PERCOCET) 5-325 MG tablet Take 1 tablet by mouth every 6 (six) hours as needed. 07/10/16   Palumbo, April, MD  predniSONE (STERAPRED UNI-PAK 21 TAB) 10 MG (21) TBPK tablet Take by mouth daily. Take 6 tabs by mouth daily  for 2 days, then 5 tabs for 2 days, then 4 tabs for 2 days, then 3 tabs for 2 days, 2 tabs for 2 days, then 1 tab by mouth daily for 2 days 09/13/19   Idelle Leech, Karnisha Lefebre, PA-C  tobramycin (TOBREX) 0.3 % ophthalmic ointment Place 1 application into the left eye 3 (three) times daily. Canthotomy site 07/10/16   Palumbo, April, MD    Allergies    Patient has no known allergies.  Review of Systems   Review of Systems  Constitutional: Negative for appetite change, chills and fever.  HENT: Positive for sore throat. Negative for ear pain, rhinorrhea, sneezing and trouble swallowing.   Eyes: Negative for photophobia and visual disturbance.  Respiratory: Negative for cough, chest tightness, shortness of breath and wheezing.   Cardiovascular:  Negative for chest pain and palpitations.  Gastrointestinal: Negative for abdominal pain, blood in stool, constipation, diarrhea, nausea and vomiting.  Genitourinary: Negative for dysuria, hematuria and urgency.  Musculoskeletal: Positive for arthralgias. Negative for myalgias.  Skin: Negative for rash.  Neurological: Negative for dizziness, weakness and light-headedness.    Physical Exam Updated Vital Signs BP (!) 145/86 (BP Location: Left Arm)   Pulse (!) 110   Temp 98 F (36.7 C) (Oral)   Resp 18   SpO2 100%   Physical Exam Vitals and nursing note reviewed.  Constitutional:      General: He is not in acute distress.    Appearance:  He is well-developed.  HENT:     Head: Normocephalic and atraumatic.     Nose: Nose normal.     Mouth/Throat:     Pharynx: Uvula midline. Posterior oropharyngeal erythema present.     Tonsils: No tonsillar exudate or tonsillar abscesses.     Comments: Bilaterally, erythematous tonsils symmetrically without exudates.  No tonsillar enlargement noted. Patient does not appear to be in acute distress. No trismus or drooling present. No pooling of secretions. Patient is tolerating secretions and is not in respiratory distress. No neck pain or tenderness to palpation of the neck. Full active and passive range of motion of the neck. No evidence of RPA or PTA. Eyes:     General: No scleral icterus.       Right eye: No discharge.        Left eye: No discharge.     Conjunctiva/sclera: Conjunctivae normal.  Cardiovascular:     Rate and Rhythm: Normal rate and regular rhythm.     Heart sounds: Normal heart sounds. No murmur heard.  No friction rub. No gallop.   Pulmonary:     Effort: Pulmonary effort is normal. No respiratory distress.     Breath sounds: Normal breath sounds.  Abdominal:     General: Bowel sounds are normal. There is no distension.     Palpations: Abdomen is soft.     Tenderness: There is no abdominal tenderness. There is no guarding.  Musculoskeletal:        General: Swelling and tenderness present. Normal range of motion.     Cervical back: Normal range of motion and neck supple.     Comments: Some edema and tenderness of the left wrist, R ankle without erythema, warmth of joint.  2+ radial pulses bilaterally, 2+ DP pulses noted bilaterally.  Normal sensation to light touch.  Ambulatory.  Able to perform flexion and extension of bilateral wrist and ankle although reports pain with movement of the left wrist and right ankle. No pitting edema noted.  Skin:    General: Skin is warm and dry.     Findings: No rash.  Neurological:     Mental Status: He is alert.     Motor: No  abnormal muscle tone.     Coordination: Coordination normal.     ED Results / Procedures / Treatments   Labs (all labs ordered are listed, but only abnormal results are displayed) Labs Reviewed  GROUP A STREP BY PCR    EKG None  Radiology No results found.  Procedures Procedures (including critical care time)  Medications Ordered in ED Medications  HYDROcodone-acetaminophen (NORCO/VICODIN) 5-325 MG per tablet 1 tablet (1 tablet Oral Given 09/13/19 1344)  predniSONE (DELTASONE) tablet 60 mg (60 mg Oral Given 09/13/19 1344)    ED Course  I have reviewed the triage vital signs and the  nursing notes.  Pertinent labs & imaging results that were available during my care of the patient were reviewed by me and considered in my medical decision making (see chart for details).    MDM Rules/Calculators/A&P                          Pt rapid strep test negative. Pt is tolerating secretions, not in respiratory distress, no neck pain, no trismus. Presentation not concerning for peritonsillar abscess or spread of infection to deep spaces of the throat; patent airway. Pt will be discharged with lidocaine to swish and spit. Tylenol as needed for pain/fever. Also concern for gout flareup in left wrist and right ankle.  There is tenderness and some swelling of the joint.  He denies any injuries.  States that this feels similar to gout flareups.  Denies history of septic joint.  He has pain with range of motion although is able to perform.  Areas are vastly intact.  Good distal pulses bilaterally of upper and lower extremities.  Doubt infectious or vascular cause of symptoms.  Will treat with short course of pain medication and steroids.  Specific return precautions discussed.  Patient's tachycardia improved here although still remains at 110.  Offered further pain control for improvement in tachycardia but patient is requesting discharge as his ride will be here to pick him up soon and he does not  have anyone else to pick him up.  Recommended PCP follow up. Pt appears safe for discharge.    Patient is hemodynamically stable, in NAD, and able to ambulate in the ED. Evaluation does not show pathology that would require ongoing emergent intervention or inpatient treatment. I explained the diagnosis to the patient. Pain has been managed and has no complaints prior to discharge. Patient is comfortable with above plan and is stable for discharge at this time. All questions were answered prior to disposition. Strict return precautions for returning to the ED were discussed. Encouraged follow up with PCP.   Prior to providing a prescription for a controlled substance, I independently reviewed the patient's recent prescription history on the [place] Controlled Substance Reporting System. The patient had no recent or regular prescriptions and was deemed appropriate for a brief, less than 3 day prescription of narcotic for acute analgesia.  An After Visit Summary was printed and given to the patient.   Portions of this note were generated with Scientist, clinical (histocompatibility and immunogenetics). Dictation errors may occur despite best attempts at proofreading.  Final Clinical Impression(s) / ED Diagnoses Final diagnoses:  Acute gout of multiple sites, unspecified cause  Viral pharyngitis    Rx / DC Orders ED Discharge Orders         Ordered    HYDROcodone-acetaminophen (NORCO/VICODIN) 5-325 MG tablet  Every 6 hours PRN     Discontinue  Reprint     09/13/19 1533    predniSONE (STERAPRED UNI-PAK 21 TAB) 10 MG (21) TBPK tablet  Daily     Discontinue  Reprint     09/13/19 1533    lidocaine (XYLOCAINE) 2 % solution  As needed     Discontinue  Reprint     09/13/19 1533           Dietrich Pates, PA-C 09/13/19 1536    Mancel Bale, MD 09/14/19 1751

## 2019-09-13 NOTE — Discharge Instructions (Addendum)
Take the medications to help with your gout flareup. Swish and spit the lidocaine to help with your discomfort. Your strep test was negative. Return to the ER for worsening joint pain, swelling, injuries or falls, trouble breathing or trouble swallowing.

## 2019-09-21 ENCOUNTER — Other Ambulatory Visit: Payer: Self-pay

## 2019-09-21 ENCOUNTER — Ambulatory Visit: Payer: Self-pay | Admitting: Critical Care Medicine

## 2019-09-21 DIAGNOSIS — M109 Gout, unspecified: Secondary | ICD-10-CM | POA: Insufficient documentation

## 2019-09-21 MED ORDER — PREDNISONE 10 MG (21) PO TBPK: ORAL_TABLET | Freq: Every day | ORAL | 0 refills | Status: DC

## 2019-09-21 MED ORDER — HYDROCODONE-ACETAMINOPHEN 5-325 MG PO TABS: 1.0000 | ORAL_TABLET | Freq: Four times a day (QID) | ORAL | 0 refills | Status: DC | PRN

## 2019-09-21 MED FILL — predniSONE 10 MG TABS: 10 | 12 days supply | Qty: 42 | Fill #0

## 2019-09-21 NOTE — Assessment & Plan Note (Signed)
Acute gout flare of multiple joints unclear underlying etiology  The patient was not able to pick up the prednisone pulse or hydrocodone acetaminophen prescription from the emergency room visit  We will provide these medications for this patient through the [facility] nurse find  It also asked the patient to return to the office for further evaluation and primary care to establish with our clinic  The patient is agreeable to this and has agreed to come to the clinic we will set up a return visit in the next 2 to 3 weeks

## 2019-09-21 NOTE — Progress Notes (Signed)
Patient needs assistance with paying for gout medications.

## 2019-09-21 NOTE — Progress Notes (Signed)
Subjective:    Patient ID: Keith Andrews, male    DOB: Sep 28, 1955, 64 y.o.   MRN: [id number]  This is a 64 year old male seen at the mobile clinic for evaluation of polyarthropathy secondary to severe gout.  The patient was just seen on 25 July in the emergency room for a gout flare and was given pulse prednisone and short course of Norco 5/325 however the patient cannot afford the medication and did not pick the medicine up  The patient does give a recurrent history of gout and at this visit his symptoms were severe pain in both wrists ankles feet and knees.  Patient denied any injury to the joints.  No other significant past history is noted.  He denies fever symptoms compatible with deep venous thrombosis numbness weakness or evidence of infection in the joints.  Patient does not have a primary care provider and is instructed on obtaining one at this time.  Note this patient did receive the Pfizer vaccine series completed in May of this year  Patient also had a complaint of throat pain and had negative strep screen at the emergency room visit however he states his throat pain has resolved.  The he was prescribed lidocaine gargle but he said that he does not need this medicine at this time as the pain is resolved    Past Medical History:  Diagnosis Date  . Hypertension      No family history on file.   Social History   Socioeconomic History  . Marital status: Single    Spouse name: Not on file  . Number of children: Not on file  . Years of education: Not on file  . Highest education level: Not on file  Occupational History  . Not on file  Tobacco Use  . Smoking status: Current Every Day Smoker    Packs/day: 0.50    Types: Cigarettes  . Smokeless tobacco: Never Used  Substance and Sexual Activity  . Alcohol use: Yes  . Drug use: Not on file  . Sexual activity: Not on file  Other Topics Concern  . Not on file  Social History Narrative  . Not on file   Social  Determinants of Health   Financial Resource Strain:   . Difficulty of Paying Living Expenses:   Food Insecurity:   . Worried About Programme researcher, broadcasting/film/video in the Last Year:   . Barista in the Last Year:   Transportation Needs:   . Freight forwarder (Medical):   Marland Kitchen Lack of Transportation (Non-Medical):   Physical Activity:   . Days of Exercise per Week:   . Minutes of Exercise per Session:   Stress:   . Feeling of Stress :   Social Connections:   . Frequency of Communication with Friends and Family:   . Frequency of Social Gatherings with Friends and Family:   . Attends Religious Services:   . Active Member of Clubs or Organizations:   . Attends Banker Meetings:   Marland Kitchen Marital Status:   Intimate Partner Violence:   . Fear of Current or Ex-Partner:   . Emotionally Abused:   Marland Kitchen Physically Abused:   . Sexually Abused:      No Known Allergies   Outpatient Medications Prior to Visit  Medication Sig Dispense Refill  . clindamycin (CLEOCIN) 300 MG capsule Take 1 capsule (300 mg total) by mouth 4 (four) times daily. X 7 days (Patient not taking: Reported on 09/21/2019) 28  capsule 0  . HYDROcodone-acetaminophen (NORCO/VICODIN) 5-325 MG tablet Take 1 tablet by mouth every 6 (six) hours as needed. (Patient not taking: Reported on 09/21/2019) 6 tablet 0  . lidocaine (XYLOCAINE) 2 % solution Use as directed 15 mLs in the mouth or throat as needed for mouth pain. (Patient not taking: Reported on 09/21/2019) 50 mL 0  . meloxicam (MOBIC) 15 MG tablet Take 1 tablet (15 mg total) by mouth daily. Take 1 daily with food. (Patient not taking: Reported on 09/21/2019) 10 tablet 0  . oxyCODONE-acetaminophen (PERCOCET) 5-325 MG tablet Take 1 tablet by mouth every 6 (six) hours as needed. 13 tablet 0  . predniSONE (STERAPRED UNI-PAK 21 TAB) 10 MG (21) TBPK tablet Take by mouth daily. Take 6 tabs by mouth daily  for 2 days, then 5 tabs for 2 days, then 4 tabs for 2 days, then 3 tabs for 2 days, 2  tabs for 2 days, then 1 tab by mouth daily for 2 days (Patient not taking: Reported on 09/21/2019) 42 tablet 0  . tobramycin (TOBREX) 0.3 % ophthalmic ointment Place 1 application into the left eye 3 (three) times daily. Canthotomy site (Patient not taking: Reported on 09/21/2019) 3.5 g 0   No facility-administered medications prior to visit.      Review of Systems  Constitutional: Negative.   HENT: Negative.   Eyes: Negative.   Respiratory: Negative.   Cardiovascular: Negative.   Gastrointestinal: Negative.   Genitourinary: Negative.   Musculoskeletal: Positive for arthralgias, gait problem and joint swelling. Negative for back pain, myalgias, neck pain and neck stiffness.  Skin: Negative.   Hematological: Negative.   Psychiatric/Behavioral: Negative.        Objective:   Physical Exam Vitals:   09/21/19 1717  BP: 132/79  Pulse: 84  Resp: 18  Temp: 98.7 F (37.1 C)  TempSrc: Oral  SpO2: 100%  Height: 5\' 8"  (1.727 m)    Gen: Pleasant, well-nourished, in no distress,  normal affect  ENT: No lesions,  mouth clear,  oropharynx clear, no postnasal drip  Neck: No JVD, no TMG, no carotid bruits  Lungs: No use of accessory muscles, no dullness to percussion, clear without rales or rhonchi  Cardiovascular: RRR, heart sounds normal, no murmur or gallops, no peripheral edema  Abdomen: soft and NT, no HSM,  BS normal  Musculoskeletal: Both hands show inflammatory changes in the metacarpal joints and wrist, there is also inflammatory changes in the feet and ankles along with the knees  No effusions are seen  Neuro: alert, non focal  Skin: Warm, no lesions or rashes      Assessment & Plan:  I personally reviewed all images and lab data in the [facility] system as well as any outside material available during this office visit and agree with the  radiology impressions.   Gout attack Acute gout flare of multiple joints unclear underlying etiology  The patient was not able to  pick up the prednisone pulse or hydrocodone acetaminophen prescription from the emergency room visit  We will provide these medications for this patient through the [facility] nurse find  It also asked the patient to return to the office for further evaluation and primary care to establish with our clinic  The patient is agreeable to this and has agreed to come to the clinic we will set up a return visit in the next 2 to 3 weeks   Seanpatrick was seen today for hospitalization follow-up.  Diagnoses and all orders for this  visit:  Acute gout of multiple sites, unspecified cause  Other orders -     HYDROcodone-acetaminophen (NORCO/VICODIN) 5-325 MG tablet; Take 1 tablet by mouth every 6 (six) hours as needed for moderate pain or severe pain. -     predniSONE (STERAPRED UNI-PAK 21 TAB) 10 MG (21) TBPK tablet; Take by mouth daily. Take 6 tabs by mouth daily  for 2 days, then 5 tabs for 2 days, then 4 tabs for 2 days, then 3 tabs for 2 days, 2 tabs for 2 days, then 1 tab by mouth daily for 2 days

## 2019-09-21 NOTE — Patient Instructions (Signed)
Your medications were sent to [facility] to pick up in the morning.  Norco one every 6 hours as needed for severe pain  Take prednisone dose pack as prescribed   See DR Delford Field in the 3 weeks in the community clinic

## 2019-09-22 MED FILL — HYDROCODON-APAP 5-325: 5-325 | 2 days supply | Qty: 10 | Fill #0

## 2019-12-14 ENCOUNTER — Emergency Department (HOSPITAL_COMMUNITY)
Admission: EM | Admit: 2019-12-14 | Discharge: 2019-12-14 | Disposition: A | Discharge status: Home / Self Care | Payer: Self-pay | Attending: Emergency Medicine | Admitting: Emergency Medicine

## 2019-12-14 ENCOUNTER — Emergency Department (HOSPITAL_COMMUNITY): Payer: Self-pay

## 2019-12-14 ENCOUNTER — Encounter (HOSPITAL_COMMUNITY): Payer: Self-pay | Admitting: Emergency Medicine

## 2019-12-14 ENCOUNTER — Other Ambulatory Visit: Payer: Self-pay

## 2019-12-14 ENCOUNTER — Telehealth: Payer: Self-pay | Admitting: *Deleted

## 2019-12-14 DIAGNOSIS — R55 Syncope and collapse: Secondary | ICD-10-CM | POA: Insufficient documentation

## 2019-12-14 DIAGNOSIS — I158 Other secondary hypertension: Secondary | ICD-10-CM | POA: Insufficient documentation

## 2019-12-14 DIAGNOSIS — W19XXXA Unspecified fall, initial encounter: Secondary | ICD-10-CM | POA: Insufficient documentation

## 2019-12-14 DIAGNOSIS — S0081XA Abrasion of other part of head, initial encounter: Secondary | ICD-10-CM | POA: Insufficient documentation

## 2019-12-14 DIAGNOSIS — I10 Essential (primary) hypertension: Secondary | ICD-10-CM | POA: Insufficient documentation

## 2019-12-14 DIAGNOSIS — S63502A Unspecified sprain of left wrist, initial encounter: Secondary | ICD-10-CM | POA: Insufficient documentation

## 2019-12-14 DIAGNOSIS — F17210 Nicotine dependence, cigarettes, uncomplicated: Secondary | ICD-10-CM | POA: Insufficient documentation

## 2019-12-14 LAB — CBC
HCT: 49.1 % (ref 39.0–52.0)
Hemoglobin: 16 g/dL (ref 13.0–17.0)
MCH: 29.3 pg (ref 26.0–34.0)
MCHC: 32.6 g/dL (ref 30.0–36.0)
MCV: 89.9 fL (ref 80.0–100.0)
Platelets: 307 10*3/uL (ref 150–400)
RBC: 5.46 MIL/uL (ref 4.22–5.81)
RDW: 14.6 % (ref 11.5–15.5)
WBC: 7.4 10*3/uL (ref 4.0–10.5)
nRBC: 0 % (ref 0.0–0.2)

## 2019-12-14 LAB — COMPREHENSIVE METABOLIC PANEL
ALT: 27 U/L (ref 0–44)
AST: 34 U/L (ref 15–41)
Albumin: 3.7 g/dL (ref 3.5–5.0)
Alkaline Phosphatase: 103 U/L (ref 38–126)
Anion gap: 11 (ref 5–15)
BUN: 13 mg/dL (ref 8–23)
CO2: 21 mmol/L — ABNORMAL LOW (ref 22–32)
Calcium: 9.8 mg/dL (ref 8.9–10.3)
Chloride: 103 mmol/L (ref 98–111)
Creatinine, Ser: 1.26 mg/dL — ABNORMAL HIGH (ref 0.61–1.24)
GFR, Estimated: >60 mL/min (ref >60)
Glucose, Bld: 128 mg/dL — ABNORMAL HIGH (ref 70–99)
Potassium: 4.5 mmol/L (ref 3.5–5.1)
Sodium: 135 mmol/L (ref 135–145)
Total Bilirubin: 0.9 mg/dL (ref 0.3–1.2)
Total Protein: 8.5 g/dL — ABNORMAL HIGH (ref 6.5–8.1)

## 2019-12-14 LAB — TROPONIN I (HIGH SENSITIVITY)
Troponin I (High Sensitivity): 9 ng/L (ref <18)
Troponin I (High Sensitivity): 7 ng/L (ref <18)

## 2019-12-14 MED ORDER — LABETALOL HCL 100 MG PO TABS: 100.0000 mg | ORAL_TABLET | Freq: Two times a day (BID) | ORAL | 1 refills | Status: DC

## 2019-12-14 MED ORDER — SODIUM CHLORIDE 0.9 % IV SOLN: 1000.0000 mL | INTRAVENOUS | Status: DC

## 2019-12-14 MED ORDER — LABETALOL HCL 200 MG PO TABS
100.0000 mg | ORAL_TABLET | Freq: Once | ORAL | Status: AC
  Administered 2019-12-14: 100 mg via ORAL
  Filled 2019-12-14: qty 1

## 2019-12-14 MED ORDER — KETOROLAC TROMETHAMINE 15 MG/ML IJ SOLN
15.0000 mg | Freq: Once | INTRAMUSCULAR | Status: AC
  Administered 2019-12-14: 15 mg via INTRAVENOUS
  Filled 2019-12-14: qty 1

## 2019-12-14 MED ORDER — ACETAMINOPHEN 500 MG PO TABS
1000.0000 mg | ORAL_TABLET | Freq: Once | ORAL | Status: AC
  Administered 2019-12-14: 1000 mg via ORAL
  Filled 2019-12-14: qty 2

## 2019-12-14 MED ORDER — MORPHINE SULFATE (PF) 4 MG/ML IV SOLN
4.0000 mg | Freq: Once | INTRAVENOUS | Status: AC
  Administered 2019-12-14: 4 mg via INTRAVENOUS
  Filled 2019-12-14: qty 1

## 2019-12-14 MED ORDER — SODIUM CHLORIDE 0.9 % IV BOLUS (SEPSIS)
1000.0000 mL | Freq: Once | INTRAVENOUS | Status: AC
  Administered 2019-12-14: 1000 mL via INTRAVENOUS

## 2019-12-14 MED ORDER — IOHEXOL 350 MG/ML SOLN
75.0000 mL | Freq: Once | INTRAVENOUS | Status: AC | PRN
  Administered 2019-12-14: 75 mL via INTRAVENOUS

## 2019-12-14 NOTE — ED Notes (Signed)
Patient back from CT.

## 2019-12-14 NOTE — ED Provider Notes (Signed)
[facility] EMERGENCY DEPARTMENT Provider Note  CSN: [id number] Arrival date & time: 12/14/19 9024  Chief Complaint(s) Shortness of Breath and Wrist Injury  HPI Keith Andrews is a 64 y.o. male   The history is provided by the patient.  Loss of Consciousness Episode history:  Single Most recent episode:  Today Timing: once. Progression:  Resolved Chronicity:  New Context comment:  Was sitting when he became sob. he stood up and passed out Witnessed: no   Relieved by:  Nothing Worsened by:  Nothing Associated symptoms: shortness of breath   Associated symptoms: no chest pain, no confusion, no diaphoresis, no difficulty breathing, no fever, no headaches, no malaise/fatigue, no nausea, no recent fall, no rectal bleeding and no vomiting    Fall resulted in left wrist pain and deformity.  Past Medical History Past Medical History:  Diagnosis Date   Hypertension    Patient Active Problem List   Diagnosis Date Noted   Gout attack 09/21/2019   Home Medication(s) Prior to Admission medications   Medication Sig Start Date End Date Taking? Authorizing Provider  HYDROcodone-acetaminophen (NORCO/VICODIN) 5-325 MG tablet Take 1 tablet by mouth every 6 (six) hours as needed for moderate pain or severe pain. Patient not taking: Reported on 12/14/2019 09/21/19   Storm Frisk, MD  labetalol (NORMODYNE) 100 MG tablet Take 1 tablet (100 mg total) by mouth 2 (two) times daily. 12/14/19 01/13/20  Nira Conn, MD  predniSONE (STERAPRED UNI-PAK 21 TAB) 10 MG (21) TBPK tablet Take by mouth daily. Take 6 tabs by mouth daily  for 2 days, then 5 tabs for 2 days, then 4 tabs for 2 days, then 3 tabs for 2 days, 2 tabs for 2 days, then 1 tab by mouth daily for 2 days Patient not taking: Reported on 12/14/2019 09/21/19   Storm Frisk, MD                                                                                                                                      Past Surgical History History reviewed. No pertinent surgical history. Family History No family history on file.  Social History Social History   Tobacco Use   Smoking status: Current Every Day Smoker    Packs/day: 0.50    Types: Cigarettes   Smokeless tobacco: Never Used  Substance Use Topics   Alcohol use: Yes   Drug use: Never   Allergies Patient has no known allergies.  Review of Systems Review of Systems  Constitutional: Negative for diaphoresis, fever and malaise/fatigue.  Respiratory: Positive for shortness of breath.   Cardiovascular: Positive for syncope. Negative for chest pain.  Gastrointestinal: Negative for nausea and vomiting.  Neurological: Negative for headaches.  Psychiatric/Behavioral: Negative for confusion.   All other systems are reviewed and are negative for acute change except as noted in the HPI  Physical Exam Vital Signs  I have  reviewed the triage vital signs BP (!) 170/90 (BP Location: Left Arm)    Pulse (!) 110    Temp 98 F (36.7 C) (Oral)    Resp 18    Ht  (1.727 m)    Wt 80 kg    SpO2 99%    BMI 26.82 kg/m   Physical Exam Vitals reviewed.  Constitutional:      General: He is not in acute distress.    Appearance: He is well-developed. He is not diaphoretic.  HENT:     Head: Normocephalic. Abrasion present.      Nose: Nose normal.  Eyes:     General: No scleral icterus.       Right eye: No discharge.        Left eye: No discharge.     Conjunctiva/sclera: Conjunctivae normal.     Pupils: Pupils are equal, round, and reactive to light.  Cardiovascular:     Rate and Rhythm: Normal rate and regular rhythm.     Heart sounds: No murmur heard.  No friction rub. No gallop.   Pulmonary:     Effort: Pulmonary effort is normal. No respiratory distress.     Breath sounds: Normal breath sounds. No stridor. No rales.  Abdominal:     General: There is no distension.     Palpations: Abdomen is soft.     Tenderness: There is no  abdominal tenderness.  Musculoskeletal:     Left wrist: Swelling, deformity, tenderness and bony tenderness present. No snuff box tenderness. Decreased range of motion.     Cervical back: Normal range of motion and neck supple.  Skin:    General: Skin is warm and dry.     Findings: No erythema or rash.  Neurological:     Mental Status: He is alert and oriented to person, place, and time.     ED Results and Treatments Labs (all labs ordered are listed, but only abnormal results are displayed) Labs Reviewed  COMPREHENSIVE METABOLIC PANEL - Abnormal; Notable for the following components:      Result Value   CO2 21 (*)    Glucose, Bld 128 (*)    Creatinine, Ser 1.26 (*)    Total Protein 8.5 (*)    All other components within normal limits  CBC  TROPONIN I (HIGH SENSITIVITY)  TROPONIN I (HIGH SENSITIVITY)                                                                                                                         EKG  EKG Interpretation  Date/Time:  Monday December 14 2019 03:02:43 EDT Ventricular Rate:  97 PR Interval:  144 QRS Duration: 80 QT Interval:  334 QTC Calculation: 424 R Axis:     Text Interpretation: Normal sinus rhythm Anterior infarct , age undetermined Abnormal ECG Confirmed by Drema Pry [phone]) on 12/14/2019 3:33:20 AM      Radiology DG Chest 2 View  Result Date: 12/14/2019 CLINICAL DATA:  Post fall.  Difficulty breathing. EXAM: CHEST - 2 VIEW COMPARISON:  Radiograph 08/13/2018 FINDINGS: Volumes are low. The heart is normal in size. Mild aortic tortuosity and atherosclerosis. Minor right infrahilar atelectasis. No focal consolidation. No pleural fluid or pneumothorax. No acute osseous abnormalities are seen. Anti lordotic positioning. IMPRESSION: Low lung volumes with minor right infrahilar atelectasis. Electronically Signed   By: Narda Rutherford M.D.   On: 12/14/2019 03:37   DG Wrist Complete Left  Result Date: 12/14/2019 CLINICAL DATA:   Fall injury deformity EXAM: LEFT WRIST - COMPLETE 3+ VIEW COMPARISON:  None. FINDINGS: There is no evidence of fracture or dislocation. There is cystic lucency seen within the lunate and ulnar styloid. There is a dorsal osteophytes seen within the proximal carpal row. Soft tissue swelling is seen along the ulnar aspect of the wrist. IMPRESSION: No acute osseous abnormality Ulnar-sided soft tissue swelling Electronically Signed   By: Jonna Clark M.D.   On: 12/14/2019 03:38   CT Head Wo Contrast  Result Date: 12/14/2019 CLINICAL DATA:  Headache with intracranial hemorrhage suspected EXAM: CT HEAD WITHOUT CONTRAST TECHNIQUE: Contiguous axial images were obtained from the base of the skull through the vertex without intravenous contrast. COMPARISON:  07/10/2016 FINDINGS: Brain: No evidence of acute infarction, hemorrhage, hydrocephalus, extra-axial collection or mass lesion/mass effect. Suspect mild patchy low-density in the cerebral white matter due to chronic small vessel disease. Vascular: Atheromatous calcification Skull: Remote nasal bone and left medial orbital wall fractures. Chronic dental disease. Sinuses/Orbits: No acute finding.  No sinusitis. IMPRESSION: No acute finding or explanation for headache. Electronically Signed   By: Marnee Spring M.D.   On: 12/14/2019 04:13   CT Angio Chest PE W and/or Wo Contrast  Result Date: 12/14/2019 CLINICAL DATA:  Shortness of breath. EXAM: CT ANGIOGRAPHY CHEST WITH CONTRAST TECHNIQUE: Multidetector CT imaging of the chest was performed using the standard protocol during bolus administration of intravenous contrast. Multiplanar CT image reconstructions and MIPs were obtained to evaluate the vascular anatomy. CONTRAST:  54 OMNIPAQUE IOHEXOL 350 MG/ML SOLN COMPARISON:  CT chest 07/10/2016 FINDINGS: Cardiovascular: The heart size is normal. No substantial pericardial effusion. Coronary artery calcification is evident. Atherosclerotic calcification is noted in the  wall of the thoracic aorta. No filling defect within the opacified pulmonary arteries to suggest the presence of an acute pulmonary embolus. Mediastinum/Nodes: No mediastinal lymphadenopathy. There is no hilar lymphadenopathy. The esophagus has normal imaging features. There is no axillary lymphadenopathy. Lungs/Pleura: No focal airspace consolidation. No suspicious pulmonary nodule or mass. Ground-glass attenuation in the dependent lungs likely related to compressive atelectasis. No pleural effusion. Upper Abdomen: Unremarkable Musculoskeletal: No worrisome lytic or sclerotic osseous abnormality. Review of the MIP images confirms the above findings. IMPRESSION: 1. No CT evidence for acute pulmonary embolus. 2. No acute findings in the chest. 3. Aortic Atherosclerosis (ICD10-I70.0). Electronically Signed   By: Kennith Center M.D.   On: 12/14/2019 06:11    Pertinent labs & imaging results that were available during my care of the patient were reviewed by me and considered in my medical decision making (see chart for details).  Medications Ordered in ED Medications  sodium chloride 0.9 % bolus 1,000 mL (0 mLs Intravenous Stopped 12/14/19 0447)    Followed by  0.9 %  sodium chloride infusion (has no administration in time range)  morphine 4 MG/ML injection 4 mg (4 mg Intravenous Given 12/14/19 0348)  iohexol (OMNIPAQUE) 350 MG/ML injection 75 mL (75 mLs Intravenous Contrast Given 12/14/19 0551)  acetaminophen (  TYLENOL) tablet 1,000 mg (1,000 mg Oral Given 12/14/19 4098)  labetalol (NORMODYNE) tablet 100 mg (100 mg Oral Given 12/14/19 0753)  ketorolac (TORADOL) 15 MG/ML injection 15 mg (15 mg Intravenous Given 12/14/19 0801)                                                                                                                                    Procedures Procedures  (including critical care time)  Medical Decision Making / ED Course I have reviewed the nursing notes for this encounter and  the patient's prior records (if available in EHR or on provided paperwork).   NATHANYL ANDUJO was evaluated in Emergency Department on 12/14/2019 for the symptoms described in the history of present illness. He was evaluated in the context of the global COVID-19 pandemic, which necessitated consideration that the patient might be at risk for infection with the SARS-CoV-2 virus that causes COVID-19. Institutional protocols and algorithms that pertain to the evaluation of patients at risk for COVID-19 are in a state of rapid change based on information released by regulatory bodies including the CDC and federal and state organizations. These policies and algorithms were followed during the patient's care in the ED.  SOB followed by syncope resulting in fall and left wrist injury.  Patient is noted to be tachycardic and mildly hypertensive. No hypoxia.  EKG without acute ischemic changes, dysrhythmias, blocks. Serial troponins were negative x2 and flat. CBC without leukocytosis or anemia. Metabolic panel without significant electrolyte derangements.  Stable renal function when compared to prior.  Given the shortness of breath and syncope, CTA was obtained to rule out PE and was negative. Given the facial trauma CT head was obtained and ruled out for ICH.  Plain film of the left wrist negative for acute fracture or dislocation. -Likely sprain.  Brace applied.  Patient was provided with IV fluids, oral and IV pain medicine.  Given the patient's hypertension, he was given labetalol and prescribed.  TOC consult for PCP and medication assistance.      Final Clinical Impression(s) / ED Diagnoses Final diagnoses:  Syncope and collapse  Sprain of left wrist, initial encounter  Other secondary hypertension   The patient appears reasonably screened and/or stabilized for discharge and I doubt any other medical condition or other [facility] requiring further screening, evaluation, or treatment in the ED at  this time prior to discharge. Safe for discharge with strict return precautions.  Disposition: Discharge  Condition: Good  I have discussed the results, Dx and Tx plan with the patient/family who expressed understanding and agree(s) with the plan. Discharge instructions discussed at length. The patient/family was given strict return precautions who verbalized understanding of the instructions. No further questions at time of discharge.    ED Discharge Orders         Ordered    labetalol (NORMODYNE) 100 MG tablet  2 times daily  12/14/19 0748            This chart was dictated using voice recognition software.  Despite best efforts to proofread,  errors can occur which can change the documentation meaning.   Nira Connardama, [place] Kappes Eduardo, MD 12/14/19 [phone]

## 2019-12-14 NOTE — Progress Notes (Signed)
Orthopedic Tech Progress Note Patient Details:  Keith Andrews 15-Jun-1955 [id number]  Ortho Devices Type of Ortho Device: Velcro wrist splint Ortho Device/Splint Location: lue Ortho Device/Splint Interventions: Ordered, Application, Adjustment   Post Interventions Patient Tolerated: Well Instructions Provided: Care of device, Adjustment of device   [name] 12/14/2019, 6:58 AM

## 2019-12-14 NOTE — Discharge Instructions (Addendum)
For pain control you may take at 1000 mg of Tylenol every 8 hours scheduled.    

## 2019-12-14 NOTE — Telephone Encounter (Signed)
ED CM attempted to reach the member to follow-up on a CM consult for medication assistance.A recording states the number is not valid. CM attempted to contact the patient using the mobile number listed in the chart. A male answered and stated it the wrong number. Physicist, medical BSN CCM

## 2019-12-14 NOTE — ED Triage Notes (Signed)
Patient reports SOB onset last night , patient added left wrist injury yesterday after a fall with swelling/pain and deformity.

## 2019-12-14 NOTE — ED Notes (Signed)
Patient transported to CT 

## 2019-12-14 NOTE — ED Notes (Signed)
Patient transported to X-ray 

## 2019-12-15 ENCOUNTER — Telehealth: Payer: Self-pay | Admitting: *Deleted

## 2019-12-15 NOTE — Telephone Encounter (Signed)
  [facility] Medication Assistance Card Name: Tyqwan Pink ID (MRN): [id number] Bin: [id number] RX Group: [id number] Discharge Date: 12/15/19 Expiration Date:12/25/19                                           (must be filled within 7 days of discharge)     You have been approved to have the prescriptions written by your discharging physician filled through our [facility] (Medication Assistance Through [facility]) program. This program allows for a one-time (no refills) 34-day supply of selected medications for a low copay amount.  The copay is $0.00 per prescription.  Only certain pharmacies are participating in this program with [facility]. You will need to select one of the pharmacies from the attached list and take your prescriptions, this letter, and your photo ID to one of the [facility] pharmacies, [facility], [facility] [address] or [facility] [address].   We are excited that you are able to use the [facility] program to get your medications. These prescriptions must be filled within 7 days of hospital discharge or they will no longer be valid for the [facility] program. Should you have any problems with your prescriptions please contact your case management team member at [phone] for [place]/ [facility].  Thank you, [facility] Management

## 2019-12-21 ENCOUNTER — Other Ambulatory Visit: Payer: Self-pay

## 2019-12-21 ENCOUNTER — Ambulatory Visit: Payer: Self-pay | Admitting: Physician Assistant

## 2019-12-21 VITALS — BP 137/86 | HR 116 | Temp 98.7°F | Resp 18 | Ht 68.0 in

## 2019-12-21 DIAGNOSIS — I10 Essential (primary) hypertension: Secondary | ICD-10-CM

## 2019-12-21 DIAGNOSIS — R000 Tachycardia, unspecified: Secondary | ICD-10-CM

## 2019-12-21 DIAGNOSIS — M25562 Pain in left knee: Secondary | ICD-10-CM

## 2019-12-21 MED ORDER — LABETALOL HCL 100 MG PO TABS: 100.0000 mg | ORAL_TABLET | Freq: Two times a day (BID) | ORAL | 1 refills | Status: DC

## 2019-12-21 MED ORDER — IBUPROFEN 600 MG PO TABS: 600.0000 mg | ORAL_TABLET | Freq: Three times a day (TID) | ORAL | 0 refills | Status: DC | PRN

## 2019-12-21 MED ORDER — KETOROLAC TROMETHAMINE 60 MG/2ML IM SOLN
60.0000 mg | Freq: Once | INTRAMUSCULAR | Status: AC
  Administered 2019-12-21: 60 mg via INTRAMUSCULAR

## 2019-12-21 MED ORDER — HYDROCODONE-ACETAMINOPHEN 10-325 MG PO TABS: 1.0000 | ORAL_TABLET | Freq: Three times a day (TID) | ORAL | 0 refills | Status: AC | PRN

## 2019-12-21 NOTE — Progress Notes (Signed)
Established Patient Office Visit  Subjective:  Patient ID: Keith Andrews, male    DOB: Jun 29, 1955  Age: 64 y.o. MRN: [id number]  CC:  Chief Complaint  Patient presents with  . Hospitalization Follow-up  . Fall    HPI Keith Andrews states that he fell on his left knee while working on 12/14/19.  States that he was seen at the ED after the fall due to hitting the side of his head, but did not complain about is knee at that time.  States that his knee started hurting a few days later and yesterday became painful to put any pressure on it.  States that he tried soaking in his bathtub last night with epsom salts without relief.  Reports that he has been compliant to his BP med.  Does not check BP at home.    Past Medical History:  Diagnosis Date  . Hypertension     History reviewed. No pertinent surgical history.  History reviewed. No pertinent family history.  Social History   Socioeconomic History  . Marital status: Single    Spouse name: Not on file  . Number of children: Not on file  . Years of education: Not on file  . Highest education level: Not on file  Occupational History  . Not on file  Tobacco Use  . Smoking status: Current Every Day Smoker    Packs/day: 0.50    Types: Cigarettes  . Smokeless tobacco: Never Used  Substance and Sexual Activity  . Alcohol use: Yes  . Drug use: Never  . Sexual activity: Not Currently  Other Topics Concern  . Not on file  Social History Narrative  . Not on file   Social Determinants of Health   Financial Resource Strain:   . Difficulty of Paying Living Expenses: Not on file  Food Insecurity:   . Worried About Programme researcher, broadcasting/film/video in the Last Year: Not on file  . Ran Out of Food in the Last Year: Not on file  Transportation Needs:   . Lack of Transportation (Medical): Not on file  . Lack of Transportation (Non-Medical): Not on file  Physical Activity:   . Days of Exercise per Week: Not on file  . Minutes of Exercise  per Session: Not on file  Stress:   . Feeling of Stress : Not on file  Social Connections:   . Frequency of Communication with Friends and Family: Not on file  . Frequency of Social Gatherings with Friends and Family: Not on file  . Attends Religious Services: Not on file  . Active Member of Clubs or Organizations: Not on file  . Attends Banker Meetings: Not on file  . Marital Status: Not on file  Intimate Partner Violence:   . Fear of Current or Ex-Partner: Not on file  . Emotionally Abused: Not on file  . Physically Abused: Not on file  . Sexually Abused: Not on file    Outpatient Medications Prior to Visit  Medication Sig Dispense Refill  . labetalol (NORMODYNE) 100 MG tablet Take 1 tablet (100 mg total) by mouth 2 (two) times daily. 60 tablet 1  . HYDROcodone-acetaminophen (NORCO/VICODIN) 5-325 MG tablet Take 1 tablet by mouth every 6 (six) hours as needed for moderate pain or severe pain. (Patient not taking: Reported on 12/14/2019) 10 tablet 0  . predniSONE (STERAPRED UNI-PAK 21 TAB) 10 MG (21) TBPK tablet Take by mouth daily. Take 6 tabs by mouth daily  for 2 days,  then 5 tabs for 2 days, then 4 tabs for 2 days, then 3 tabs for 2 days, 2 tabs for 2 days, then 1 tab by mouth daily for 2 days (Patient not taking: Reported on 12/14/2019) 42 tablet 0   No facility-administered medications prior to visit.    No Known Allergies  ROS Review of Systems  Constitutional: Negative.   HENT: Negative.   Eyes: Negative.   Respiratory: Negative.   Cardiovascular: Negative for chest pain and palpitations.  Gastrointestinal: Negative.   Endocrine: Negative.   Genitourinary: Negative.   Musculoskeletal: Positive for back pain, gait problem and myalgias.  Skin: Negative.   Allergic/Immunologic: Negative.   Neurological: Negative for dizziness, syncope, speech difficulty and light-headedness.  Hematological: Negative.   Psychiatric/Behavioral: Negative.        Objective:    Physical Exam Vitals and nursing note reviewed.  Constitutional:      General: He is not in acute distress.    Appearance: Normal appearance. He is not ill-appearing.  HENT:     Head: Normocephalic and atraumatic.     Right Ear: External ear normal.     Left Ear: External ear normal.     Nose: Nose normal.     Mouth/Throat:     Mouth: Mucous membranes are moist.     Pharynx: Oropharynx is clear.  Eyes:     Extraocular Movements: Extraocular movements intact.     Conjunctiva/sclera: Conjunctivae normal.     Pupils: Pupils are equal, round, and reactive to light.  Cardiovascular:     Rate and Rhythm: Normal rate and regular rhythm.  Pulmonary:     Effort: Pulmonary effort is normal.     Breath sounds: Normal breath sounds.  Abdominal:     General: Abdomen is flat.     Palpations: Abdomen is soft.  Musculoskeletal:     Cervical back: Normal range of motion and neck supple.     Right knee: Normal.     Left knee: No swelling or erythema. Decreased range of motion. Tenderness present.     Right lower leg: Normal.     Left lower leg: Normal.     Right ankle: Normal.     Left ankle: Normal.     Right foot: Normal.     Left foot: Normal.  Skin:    General: Skin is warm and dry.  Neurological:     General: No focal deficit present.     Mental Status: He is alert and oriented to person, place, and time.  Psychiatric:        Mood and Affect: Mood normal.        Behavior: Behavior normal.        Thought Content: Thought content normal.        Judgment: Judgment normal.     BP 137/86 (BP Location: Left Arm, Patient Position: Sitting, Cuff Size: Normal)   Pulse (!) 116   Temp 98.7 F (37.1 C) (Oral)   Resp 18   Ht 5\' 8"  (1.727 m)   SpO2 98%   BMI 26.82 kg/m  Wt Readings from Last 3 Encounters:  12/14/19 176 lb 5.9 oz (80 kg)  08/13/18 165 lb (74.8 kg)  08/22/14 150 lb (68 kg)     Health Maintenance Due  Topic Date Due  . Hepatitis C Screening   Never done  . HIV Screening  Never done  . COLONOSCOPY  Never done  . INFLUENZA VACCINE  Never done    There are  no preventive care reminders to display for this patient.  No results found for: TSH Lab Results  Component Value Date   WBC 7.4 12/14/2019   HGB 16.0 12/14/2019   HCT 49.1 12/14/2019   MCV 89.9 12/14/2019   PLT 307 12/14/2019   Lab Results  Component Value Date   NA 135 12/14/2019   K 4.5 12/14/2019   CO2 21 (L) 12/14/2019   GLUCOSE 128 (H) 12/14/2019   BUN 13 12/14/2019   CREATININE 1.26 (H) 12/14/2019   BILITOT 0.9 12/14/2019   ALKPHOS 103 12/14/2019   AST 34 12/14/2019   ALT 27 12/14/2019   PROT 8.5 (H) 12/14/2019   ALBUMIN 3.7 12/14/2019   CALCIUM 9.8 12/14/2019   ANIONGAP 11 12/14/2019   No results found for: CHOL No results found for: HDL No results found for: LDLCALC No results found for: TRIG No results found for: CHOLHDL No results found for: AOZH0Q    Assessment & Plan:   Problem List Items Addressed This Visit    None    Visit Diagnoses    Essential hypertension    -  Primary   Relevant Medications   labetalol (NORMODYNE) 100 MG tablet   Tachycardia, unspecified       Relevant Medications   labetalol (NORMODYNE) 100 MG tablet   Acute pain of left knee       Relevant Medications   ibuprofen (ADVIL) 600 MG tablet   HYDROcodone-acetaminophen (NORCO) 10-325 MG tablet   ketorolac (TORADOL) injection 60 mg (Completed)   Other Relevant Orders   DG Knee Complete 4 Views Left    1. Essential hypertension Appointment made for follow up at [facility] with Dr. Delford Field on 01/20/20.  Continue current regimen  2. Tachycardia, unspecified Heart rate improved after resting to 98, continue current regimen - labetalol (NORMODYNE) 100 MG tablet; Take 1 tablet (100 mg total) by mouth 2 (two) times daily.  Dispense: 60 tablet; Refill: 1  3. Acute pain of left knee Patient education given, RICE,encouraged bracing, patient given ACE bandage  - DG Knee  Complete 4 Views Left; Future - ibuprofen (ADVIL) 600 MG tablet; Take 1 tablet (600 mg total) by mouth every 8 (eight) hours as needed.  Dispense: 30 tablet; Refill: 0 - HYDROcodone-acetaminophen (NORCO) 10-325 MG tablet; Take 1 tablet by mouth every 8 (eight) hours as needed for up to 5 days.  Dispense: 15 tablet; Refill: 0 - ketorolac (TORADOL) injection 60 mg   Meds ordered this encounter  Medications  . labetalol (NORMODYNE) 100 MG tablet    Sig: Take 1 tablet (100 mg total) by mouth 2 (two) times daily.    Dispense:  60 tablet    Refill:  1    Order Specific Question:   Supervising Provider    Answer:   Delford Field, PATRICK E [[id number]]  . ibuprofen (ADVIL) 600 MG tablet    Sig: Take 1 tablet (600 mg total) by mouth every 8 (eight) hours as needed.    Dispense:  30 tablet    Refill:  0    Order Specific Question:   Supervising Provider    Answer:   Delford Field, PATRICK E [[id number]]  . HYDROcodone-acetaminophen (NORCO) 10-325 MG tablet    Sig: Take 1 tablet by mouth every 8 (eight) hours as needed for up to 5 days.    Dispense:  15 tablet    Refill:  0    Order Specific Question:   Supervising Provider    Answer:   Shan Levans  E [[id number]]  . ketorolac (TORADOL) injection 60 mg    I have reviewed the patient's medical history (PMH, PSH, Social History, Family History, Medications, and allergies) , and have been updated if relevant. I spent 30 minutes reviewing chart and  face to face time with patient.    Follow-up: Return in about 1 month (around 01/20/2020) for  , At [facility]    Kasandra Knudsen Mayers, PA-C

## 2019-12-21 NOTE — Progress Notes (Signed)
Patient eaten today and taken tylenol and BP medication. 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 Patient complains of misstepping and hitting his knee on the concrete. Patient scales pain at a 10.

## 2019-12-21 NOTE — Patient Instructions (Signed)
I sent an order for an xray of your left knee to [facility], please go at your earliest convenience  You can take ibuprofen 600 mg every 6-8 hours for the pain, and use Norco for the breakthrough/severe pain - keep your knee elevated, use ice and wear a brace for support  Please make sure to follow up with Dr. Delford Field at [facility] for further evaluation of your elevated blood pressure and heart rate  I hope that you feel better soon  Roney Jaffe, PA-C Physician Assistant [facility] [email or web address]    Acute Knee Pain, Adult Acute knee pain is sudden and may be caused by damage, swelling, or irritation of the muscles and tissues that support your knee. The injury may result from:  A fall.  An injury to your knee from twisting motions.  A hit to the knee.  Infection. Acute knee pain may go away on its own with time and rest. If it does not, your health care provider may order tests to find the cause of the pain. These may include:  Imaging tests, such as an X-ray, MRI, or ultrasound.  Joint aspiration. In this test, fluid is removed from the knee.  Arthroscopy. In this test, a lighted tube is inserted into the knee and an image is projected onto a TV screen.  Biopsy. In this test, a sample of tissue is removed from the body and studied under a microscope. Follow these instructions at home: Pay attention to any changes in your symptoms. Take these actions to relieve your pain. If you have a knee sleeve or brace:   Wear the sleeve or brace as told by your health care provider. Remove it only as told by your health care provider.  Loosen the sleeve or brace if your toes tingle, become numb, or turn cold and blue.  Keep the sleeve or brace clean.  If the sleeve or brace is not waterproof: ? Do not let it get wet. ? Cover it with a watertight covering when you take a bath or  shower. Activity  Rest your knee.  Do not do things that cause pain or make pain worse.  Avoid high-impact activities or exercises, such as running, jumping rope, or doing jumping jacks.  Work with a physical therapist to make a safe exercise program, as recommended by your health care provider. Do exercises as told by your physical therapist. Managing pain, stiffness, and swelling   If directed, put ice on the knee: ? Put ice in a plastic bag. ? Place a towel between your skin and the bag. ? Leave the ice on for 20 minutes, 2-3 times a day.  If directed, use an elastic bandage to put pressure (compression) on your injured knee. This may control swelling, give support, and help with discomfort. General instructions  Take over-the-counter and prescription medicines only as told by your health care provider.  Raise (elevate) your knee above the level of your heart when you are sitting or lying down.  Sleep with a pillow under your knee.  Do not use any products that contain nicotine or tobacco, such as cigarettes, e-cigarettes, and chewing tobacco. These can delay healing. If you need help quitting, ask your health care provider.  If you are overweight, work with your health care provider and a dietitian to set a weight-loss goal that is healthy and reasonable for you. Extra weight can put pressure on your knee.  Keep all follow-up visits as  told by your health care provider. This is important. Contact a health care provider if:  Your knee pain continues, changes, or gets worse.  You have a fever along with knee pain.  Your knee feels warm to the touch.  Your knee buckles or locks up. Get help right away if:  Your knee swells, and the swelling becomes worse.  You cannot move your knee.  You have severe pain in your knee. Summary  Acute knee pain can be caused by a fall, an injury, an infection, or damage, swelling, or irritation of the tissues that support your  knee.  Your health care provider may perform tests to find out the cause of the pain.  Pay attention to any changes in your symptoms. Relieve your pain with rest, medicines, light activity, and use of ice.  Get help if your pain continues or becomes worse, your knee swells, or you cannot move your knee. This information is not intended to replace advice given to you by your health care provider. Make sure you discuss any questions you have with your health care provider. Document Revised: 07/18/2017 Document Reviewed: 07/18/2017 Elsevier Patient Education  2020 [employer].

## 2020-01-18 ENCOUNTER — Ambulatory Visit: Payer: Self-pay | Admitting: Nurse Practitioner

## 2020-01-20 ENCOUNTER — Ambulatory Visit: Payer: Self-pay | Admitting: Critical Care Medicine

## 2020-01-20 NOTE — Progress Notes (Deleted)
Subjective:    Patient ID: Keith Andrews, male    DOB: 11/08/1955, 64 y.o.   MRN: [id number]  09/21/19 is a 64 year old male seen at the mobile clinic for evaluation of polyarthropathy secondary to severe gout.  The patient was just seen on 25 July in the emergency room for a gout flare and was given pulse prednisone and short course of Norco 5/325 however the patient cannot afford the medication and did not pick the medicine up  The patient does give a recurrent history of gout and at this visit his symptoms were severe pain in both wrists ankles feet and knees.  Patient denied any injury to the joints.  No other significant past history is noted.  He denies fever symptoms compatible with deep venous thrombosis numbness weakness or evidence of infection in the joints.  Patient does not have a primary care provider and is instructed on obtaining one at this time.  Note this patient did receive the Pfizer vaccine series completed in May of this year  Patient also had a complaint of throat pain and had negative strep screen at the emergency room visit however he states his throat pain has resolved.  The he was prescribed lidocaine gargle but he said that he does not need this medicine at this time as the pain is resolved  01/20/2020 F/u to est pcp Was seen 12/21/19 at [facility] [name] states that he fell on his left knee while working on 12/14/19.  States that he was seen at the ED after the fall due to hitting the side of his head, but did not complain about is knee at that time.  States that his knee started hurting a few days later and yesterday became painful to put any pressure on it.  States that he tried soaking in his bathtub last night with epsom salts without relief.  Reports that he has been compliant to his BP med.  Does not check BP at home.   1. Essential hypertension Appointment made for follow up at [facility] with Dr. Delford Field on 01/20/20.  Continue current regimen  2. Tachycardia,  unspecified Heart rate improved after resting to 98, continue current regimen - labetalol (NORMODYNE) 100 MG tablet; Take 1 tablet (100 mg total) by mouth 2 (two) times daily.  Dispense: 60 tablet; Refill: 1  3. Acute pain of left knee Patient education given, RICE,encouraged bracing, patient given ACE bandage  - DG Knee Complete 4 Views Left; Future - ibuprofen (ADVIL) 600 MG tablet; Take 1 tablet (600 mg total) by mouth every 8 (eight) hours as needed.  Dispense: 30 tablet; Refill: 0 - HYDROcodone-acetaminophen (NORCO) 10-325 MG tablet; Take 1 tablet by mouth every 8 (eight) hours as needed for up to 5 days.  Dispense: 15 tablet; Refill: 0 - ketorolac (TORADOL) injection 60 mg   Gout attack Acute gout flare of multiple joints unclear underlying etiology  The patient was not able to pick up the prednisone pulse or hydrocodone acetaminophen prescription from the emergency room visit  We will provide these medications for this patient through the [facility] nurse find  It also asked the patient to return to the office for further evaluation and primary care to establish with our clinic  The patient is agreeable to this and has agreed to come to the clinic we will set up a return visit in the next 2 to 3 weeks   [place] was seen today for hospitalization follow-up.  Diagnoses and all orders for  this visit:  Acute gout of multiple sites, unspecified cause  Other orders -     HYDROcodone-acetaminophen (NORCO/VICODIN) 5-325 MG tablet; Take 1 tablet by mouth every 6 (six) hours as needed for moderate pain or severe pain. -     predniSONE (STERAPRED UNI-PAK 21 TAB) 10 MG (21) TBPK tablet; Take by mouth daily. Take 6 tabs by mouth daily  for 2 days, then 5 tabs for 2 days, then 4 tabs for 2 days, then 3 tabs for 2 days, 2 tabs for 2 days, then 1 tab by mouth daily for 2 days     Past Medical History:  Diagnosis Date  . Hypertension      No family history on file.   Social  History   Socioeconomic History  . Marital status: Single    Spouse name: Not on file  . Number of children: Not on file  . Years of education: Not on file  . Highest education level: Not on file  Occupational History  . Not on file  Tobacco Use  . Smoking status: Current Every Day Smoker    Packs/day: 0.50    Types: Cigarettes  . Smokeless tobacco: Never Used  Substance and Sexual Activity  . Alcohol use: Yes  . Drug use: Never  . Sexual activity: Not Currently  Other Topics Concern  . Not on file  Social History Narrative  . Not on file   Social Determinants of Health   Financial Resource Strain:   . Difficulty of Paying Living Expenses: Not on file  Food Insecurity:   . Worried About Programme researcher, broadcasting/film/video in the Last Year: Not on file  . Ran Out of Food in the Last Year: Not on file  Transportation Needs:   . Lack of Transportation (Medical): Not on file  . Lack of Transportation (Non-Medical): Not on file  Physical Activity:   . Days of Exercise per Week: Not on file  . Minutes of Exercise per Session: Not on file  Stress:   . Feeling of Stress : Not on file  Social Connections:   . Frequency of Communication with Friends and Family: Not on file  . Frequency of Social Gatherings with Friends and Family: Not on file  . Attends Religious Services: Not on file  . Active Member of Clubs or Organizations: Not on file  . Attends Banker Meetings: Not on file  . Marital Status: Not on file  Intimate Partner Violence:   . Fear of Current or Ex-Partner: Not on file  . Emotionally Abused: Not on file  . Physically Abused: Not on file  . Sexually Abused: Not on file     No Known Allergies   Outpatient Medications Prior to Visit  Medication Sig Dispense Refill  . ibuprofen (ADVIL) 600 MG tablet Take 1 tablet (600 mg total) by mouth every 8 (eight) hours as needed. 30 tablet 0  . labetalol (NORMODYNE) 100 MG tablet Take 1 tablet (100 mg total) by mouth 2  (two) times daily. 60 tablet 1   No facility-administered medications prior to visit.      Review of Systems  Constitutional: Negative.   HENT: Negative.   Eyes: Negative.   Respiratory: Negative.   Cardiovascular: Negative.   Gastrointestinal: Negative.   Genitourinary: Negative.   Musculoskeletal: Positive for arthralgias, gait problem and joint swelling. Negative for back pain, myalgias, neck pain and neck stiffness.  Skin: Negative.   Hematological: Negative.   Psychiatric/Behavioral: Negative.  Objective:   Physical Exam There were no vitals filed for this visit.  Gen: Pleasant, well-nourished, in no distress,  normal affect  ENT: No lesions,  mouth clear,  oropharynx clear, no postnasal drip  Neck: No JVD, no TMG, no carotid bruits  Lungs: No use of accessory muscles, no dullness to percussion, clear without rales or rhonchi  Cardiovascular: RRR, heart sounds normal, no murmur or gallops, no peripheral edema  Abdomen: soft and NT, no HSM,  BS normal  Musculoskeletal: Both hands show inflammatory changes in the metacarpal joints and wrist, there is also inflammatory changes in the feet and ankles along with the knees  No effusions are seen  Neuro: alert, non focal  Skin: Warm, no lesions or rashes      Assessment & Plan:  I personally reviewed all images and lab data in the [facility] system as well as any outside material available during this office visit and agree with the  radiology impressions.   No problem-specific Assessment & Plan notes found for this encounter.   There are no diagnoses linked to this encounter.

## 2020-12-30 ENCOUNTER — Emergency Department (HOSPITAL_COMMUNITY): Payer: Self-pay

## 2020-12-30 ENCOUNTER — Encounter (HOSPITAL_COMMUNITY): Payer: Self-pay | Admitting: Emergency Medicine

## 2020-12-30 ENCOUNTER — Emergency Department (HOSPITAL_COMMUNITY)
Admission: EM | Admit: 2020-12-30 | Discharge: 2020-12-30 | Disposition: A | Discharge status: Home / Self Care | Payer: Self-pay | Attending: Medical | Admitting: Medical

## 2020-12-30 ENCOUNTER — Emergency Department (HOSPITAL_COMMUNITY)
Admission: EM | Admit: 2020-12-30 | Discharge: 2020-12-31 | Disposition: A | Discharge status: Home / Self Care | Payer: Self-pay | Attending: Emergency Medicine | Admitting: Emergency Medicine

## 2020-12-30 ENCOUNTER — Other Ambulatory Visit: Payer: Self-pay

## 2020-12-30 DIAGNOSIS — N5082 Scrotal pain: Secondary | ICD-10-CM | POA: Insufficient documentation

## 2020-12-30 DIAGNOSIS — S301XXA Contusion of abdominal wall, initial encounter: Secondary | ICD-10-CM

## 2020-12-30 DIAGNOSIS — K8020 Calculus of gallbladder without cholecystitis without obstruction: Secondary | ICD-10-CM

## 2020-12-30 DIAGNOSIS — W501XXA Accidental kick by another person, initial encounter: Secondary | ICD-10-CM | POA: Insufficient documentation

## 2020-12-30 DIAGNOSIS — F17210 Nicotine dependence, cigarettes, uncomplicated: Secondary | ICD-10-CM | POA: Insufficient documentation

## 2020-12-30 DIAGNOSIS — Z5321 Procedure and treatment not carried out due to patient leaving prior to being seen by health care provider: Secondary | ICD-10-CM | POA: Insufficient documentation

## 2020-12-30 DIAGNOSIS — Z79899 Other long term (current) drug therapy: Secondary | ICD-10-CM | POA: Insufficient documentation

## 2020-12-30 DIAGNOSIS — K5790 Diverticulosis of intestine, part unspecified, without perforation or abscess without bleeding: Secondary | ICD-10-CM

## 2020-12-30 DIAGNOSIS — I10 Essential (primary) hypertension: Secondary | ICD-10-CM | POA: Insufficient documentation

## 2020-12-30 DIAGNOSIS — R1030 Lower abdominal pain, unspecified: Secondary | ICD-10-CM | POA: Insufficient documentation

## 2020-12-30 DIAGNOSIS — Y92512 Supermarket, store or market as the place of occurrence of the external cause: Secondary | ICD-10-CM | POA: Insufficient documentation

## 2020-12-30 DIAGNOSIS — Y906 Blood alcohol level of 120-199 mg/100 ml: Secondary | ICD-10-CM | POA: Insufficient documentation

## 2020-12-30 DIAGNOSIS — F10920 Alcohol use, unspecified with intoxication, uncomplicated: Secondary | ICD-10-CM

## 2020-12-30 DIAGNOSIS — K4020 Bilateral inguinal hernia, without obstruction or gangrene, not specified as recurrent: Secondary | ICD-10-CM

## 2020-12-30 LAB — BASIC METABOLIC PANEL
Anion gap: 15 (ref 5–15)
BUN: 12 mg/dL (ref 8–23)
CO2: 14 mmol/L — ABNORMAL LOW (ref 22–32)
Calcium: 9 mg/dL (ref 8.9–10.3)
Chloride: 100 mmol/L (ref 98–111)
Creatinine, Ser: 1.25 mg/dL — ABNORMAL HIGH (ref 0.61–1.24)
GFR, Estimated: >60 mL/min (ref >60)
Glucose, Bld: 235 mg/dL — ABNORMAL HIGH (ref 70–99)
Potassium: 3.8 mmol/L (ref 3.5–5.1)
Sodium: 129 mmol/L — ABNORMAL LOW (ref 135–145)

## 2020-12-30 LAB — URINALYSIS, ROUTINE W REFLEX MICROSCOPIC
Bilirubin Urine: NEGATIVE
Glucose, UA: NEGATIVE mg/dL
Hgb urine dipstick: NEGATIVE
Ketones, ur: NEGATIVE mg/dL
Leukocytes,Ua: NEGATIVE
Nitrite: NEGATIVE
Protein, ur: NEGATIVE mg/dL
Specific Gravity, Urine: 1.039 — ABNORMAL HIGH (ref 1.005–1.030)
pH: 5 (ref 5.0–8.0)

## 2020-12-30 LAB — CBC WITH DIFFERENTIAL/PLATELET
Abs Immature Granulocytes: 0.03 10*3/uL (ref 0.00–0.07)
Basophils Absolute: 0 10*3/uL (ref 0.0–0.1)
Basophils Relative: 0 %
Eosinophils Absolute: 0.1 10*3/uL (ref 0.0–0.5)
Eosinophils Relative: 1 %
HCT: 42.8 % (ref 39.0–52.0)
Hemoglobin: 14.3 g/dL (ref 13.0–17.0)
Immature Granulocytes: 0 %
Lymphocytes Relative: 52 %
Lymphs Abs: 5.6 10*3/uL — ABNORMAL HIGH (ref 0.7–4.0)
MCH: 31.4 pg (ref 26.0–34.0)
MCHC: 33.4 g/dL (ref 30.0–36.0)
MCV: 93.9 fL (ref 80.0–100.0)
Monocytes Absolute: 0.9 10*3/uL (ref 0.1–1.0)
Monocytes Relative: 8 %
Neutro Abs: 4.1 10*3/uL (ref 1.7–7.7)
Neutrophils Relative %: 39 %
Platelets: 281 10*3/uL (ref 150–400)
RBC: 4.56 MIL/uL (ref 4.22–5.81)
RDW: 12.2 % (ref 11.5–15.5)
WBC: 10.7 10*3/uL — ABNORMAL HIGH (ref 4.0–10.5)
nRBC: 0 % (ref 0.0–0.2)

## 2020-12-30 LAB — ETHANOL: Alcohol, Ethyl (B): 145 mg/dL — ABNORMAL HIGH (ref <10)

## 2020-12-30 MED ORDER — IOHEXOL 350 MG/ML SOLN
80.0000 mL | Freq: Once | INTRAVENOUS | Status: AC | PRN
  Administered 2020-12-30: 80 mL via INTRAVENOUS

## 2020-12-30 MED ORDER — ONDANSETRON 4 MG PO TBDP
4.0000 mg | ORAL_TABLET | Freq: Once | ORAL | Status: AC
  Administered 2020-12-30: 4 mg via ORAL
  Filled 2020-12-30: qty 1

## 2020-12-30 MED ORDER — HYDROMORPHONE HCL 1 MG/ML IJ SOLN
0.5000 mg | Freq: Once | INTRAMUSCULAR | Status: AC
  Administered 2020-12-30: 0.5 mg via INTRAVENOUS
  Filled 2020-12-30: qty 1

## 2020-12-30 MED ORDER — IBUPROFEN 400 MG PO TABS
600.0000 mg | ORAL_TABLET | Freq: Once | ORAL | Status: AC
  Administered 2020-12-30: 600 mg via ORAL
  Filled 2020-12-30: qty 1

## 2020-12-30 NOTE — ED Notes (Signed)
Refused temp

## 2020-12-30 NOTE — ED Notes (Signed)
Patient aware urine sample is needed. Provided with urinal.

## 2020-12-30 NOTE — Discharge Instructions (Addendum)
It was our pleasure to provide your ER care today - we hope that you feel better. Drink plenty of fluids/stay well hydrated. Take acetaminophen or ibuprofen as need.   Your ct scan was read as showing:1. Mild soft tissue edema in the right inguinal region. No confluent  hematoma. 2. No additional acute traumatic injury to the abdomen or pelvis. 3. Hepatomegaly and hepatic steatosis. Cholelithiasis without gallbladder inflammation. 4. Small volume abdominopelvic ascites, simple fluid density and not consistent with hemorrhage. 5. Right inguinal hernia contains a knuckle of small bowel without obstruction or inflammation. There is also herniation of fat and small amount of free fluid. Left inguinal hernia contains fat and small amount of free fluid. 6. Colonic diverticulosis without diverticulitis.  7. Possible punctate nonobstructing stone in the upper left kidney versus renal vascular calcification.   For hernias and gallstones, follow up with general surgeon in the next 1-2 weeks - call office Monday to arrange appointment.   Also follow closely with primary care doctor for recheck - also discuss above ct findings with them.  Return to ER right away if worse, new symptoms, new, worsening or severe pain, severe abdominal pain or hernia pain, persistent vomiting, fevers, or other concern.   You were given pain meds in the ER - no driving for the next 6 hours, or if/when drinking alcohol.
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
## 2020-12-30 NOTE — ED Notes (Signed)
Reported patient is spitting at staff
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
## 2020-12-30 NOTE — ED Notes (Signed)
Requested urine sample from patient who reports he cannot provide sample at this time.
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
## 2020-12-31 ENCOUNTER — Emergency Department (HOSPITAL_COMMUNITY): Payer: Self-pay

## 2020-12-31 ENCOUNTER — Emergency Department (HOSPITAL_COMMUNITY): Payer: Self-pay | Admitting: Anesthesiology

## 2020-12-31 ENCOUNTER — Encounter (HOSPITAL_COMMUNITY): Admission: EM | Disposition: A | Discharge status: Home / Self Care | Payer: Self-pay | Source: Home / Self Care

## 2020-12-31 ENCOUNTER — Inpatient Hospital Stay (HOSPITAL_COMMUNITY): Payer: Self-pay

## 2020-12-31 ENCOUNTER — Inpatient Hospital Stay (HOSPITAL_COMMUNITY)
Admission: EM | Admit: 2020-12-31 | Discharge: 2021-01-17 | DRG: 871 | Disposition: A | Discharge status: Home / Self Care | Payer: Self-pay | Attending: General Surgery | Admitting: General Surgery

## 2020-12-31 ENCOUNTER — Encounter (HOSPITAL_COMMUNITY): Payer: Self-pay | Admitting: Emergency Medicine

## 2020-12-31 DIAGNOSIS — M10071 Idiopathic gout, right ankle and foot: Secondary | ICD-10-CM | POA: Diagnosis not present

## 2020-12-31 DIAGNOSIS — N179 Acute kidney failure, unspecified: Secondary | ICD-10-CM | POA: Diagnosis present

## 2020-12-31 DIAGNOSIS — F17210 Nicotine dependence, cigarettes, uncomplicated: Secondary | ICD-10-CM | POA: Diagnosis present

## 2020-12-31 DIAGNOSIS — R000 Tachycardia, unspecified: Secondary | ICD-10-CM

## 2020-12-31 DIAGNOSIS — H269 Unspecified cataract: Secondary | ICD-10-CM | POA: Diagnosis present

## 2020-12-31 DIAGNOSIS — R410 Disorientation, unspecified: Secondary | ICD-10-CM | POA: Diagnosis present

## 2020-12-31 DIAGNOSIS — J9601 Acute respiratory failure with hypoxia: Secondary | ICD-10-CM | POA: Diagnosis present

## 2020-12-31 DIAGNOSIS — R52 Pain, unspecified: Secondary | ICD-10-CM

## 2020-12-31 DIAGNOSIS — Z781 Physical restraint status: Secondary | ICD-10-CM

## 2020-12-31 DIAGNOSIS — Z978 Presence of other specified devices: Secondary | ICD-10-CM

## 2020-12-31 DIAGNOSIS — K9189 Other postprocedural complications and disorders of digestive system: Secondary | ICD-10-CM | POA: Diagnosis not present

## 2020-12-31 DIAGNOSIS — S3991XA Unspecified injury of abdomen, initial encounter: Secondary | ICD-10-CM

## 2020-12-31 DIAGNOSIS — I10 Essential (primary) hypertension: Secondary | ICD-10-CM | POA: Diagnosis present

## 2020-12-31 DIAGNOSIS — K631 Perforation of intestine (nontraumatic): Secondary | ICD-10-CM | POA: Diagnosis present

## 2020-12-31 DIAGNOSIS — R188 Other ascites: Secondary | ICD-10-CM | POA: Diagnosis present

## 2020-12-31 DIAGNOSIS — Z4659 Encounter for fitting and adjustment of other gastrointestinal appliance and device: Secondary | ICD-10-CM

## 2020-12-31 DIAGNOSIS — Z20822 Contact with and (suspected) exposure to covid-19: Secondary | ICD-10-CM | POA: Diagnosis present

## 2020-12-31 DIAGNOSIS — Z79899 Other long term (current) drug therapy: Secondary | ICD-10-CM

## 2020-12-31 DIAGNOSIS — K659 Peritonitis, unspecified: Secondary | ICD-10-CM | POA: Diagnosis present

## 2020-12-31 DIAGNOSIS — A419 Sepsis, unspecified organism: Principal | ICD-10-CM | POA: Diagnosis present

## 2020-12-31 DIAGNOSIS — F10139 Alcohol abuse with withdrawal, unspecified: Secondary | ICD-10-CM | POA: Diagnosis present

## 2020-12-31 DIAGNOSIS — W19XXXA Unspecified fall, initial encounter: Secondary | ICD-10-CM | POA: Diagnosis not present

## 2020-12-31 DIAGNOSIS — R1030 Lower abdominal pain, unspecified: Secondary | ICD-10-CM

## 2020-12-31 DIAGNOSIS — E876 Hypokalemia: Secondary | ICD-10-CM | POA: Diagnosis not present

## 2020-12-31 DIAGNOSIS — S5001XA Contusion of right elbow, initial encounter: Secondary | ICD-10-CM | POA: Diagnosis not present

## 2020-12-31 DIAGNOSIS — K567 Ileus, unspecified: Secondary | ICD-10-CM | POA: Diagnosis not present

## 2020-12-31 DIAGNOSIS — S0001XA Abrasion of scalp, initial encounter: Secondary | ICD-10-CM | POA: Diagnosis not present

## 2020-12-31 HISTORY — PX: LAPAROTOMY: SHX154

## 2020-12-31 LAB — I-STAT CHEM 8, ED
BUN: 19 mg/dL (ref 8–23)
Calcium, Ion: 1.06 mmol/L — ABNORMAL LOW (ref 1.15–1.40)
Chloride: 103 mmol/L (ref 98–111)
Creatinine, Ser: 1.9 mg/dL — ABNORMAL HIGH (ref 0.61–1.24)
Glucose, Bld: 253 mg/dL — ABNORMAL HIGH (ref 70–99)
HCT: 52 % (ref 39.0–52.0)
Hemoglobin: 17.7 g/dL — ABNORMAL HIGH (ref 13.0–17.0)
Potassium: 5 mmol/L (ref 3.5–5.1)
Sodium: 132 mmol/L — ABNORMAL LOW (ref 135–145)
TCO2: 17 mmol/L — ABNORMAL LOW (ref 22–32)

## 2020-12-31 LAB — CBC WITH DIFFERENTIAL/PLATELET
Abs Immature Granulocytes: 0.02 10*3/uL (ref 0.00–0.07)
Basophils Absolute: 0 10*3/uL (ref 0.0–0.1)
Basophils Relative: 0 %
Eosinophils Absolute: 0 10*3/uL (ref 0.0–0.5)
Eosinophils Relative: 0 %
HCT: 48.3 % (ref 39.0–52.0)
Hemoglobin: 16.4 g/dL (ref 13.0–17.0)
Immature Granulocytes: 0 %
Lymphocytes Relative: 15 %
Lymphs Abs: 1.1 10*3/uL (ref 0.7–4.0)
MCH: 31.1 pg (ref 26.0–34.0)
MCHC: 34 g/dL (ref 30.0–36.0)
MCV: 91.5 fL (ref 80.0–100.0)
Monocytes Absolute: 0.3 10*3/uL (ref 0.1–1.0)
Monocytes Relative: 4 %
Neutro Abs: 5.8 10*3/uL (ref 1.7–7.7)
Neutrophils Relative %: 81 %
Platelets: 269 10*3/uL (ref 150–400)
RBC: 5.28 MIL/uL (ref 4.22–5.81)
RDW: 12.4 % (ref 11.5–15.5)
WBC: 7.3 10*3/uL (ref 4.0–10.5)
nRBC: 0 % (ref 0.0–0.2)

## 2020-12-31 LAB — TYPE AND SCREEN
ABO/RH(D): O POS
Antibody Screen: NEGATIVE

## 2020-12-31 LAB — BLOOD GAS, VENOUS
Acid-base deficit: 8 mmol/L — ABNORMAL HIGH (ref 0.0–2.0)
Bicarbonate: 18.6 mmol/L — ABNORMAL LOW (ref 20.0–28.0)
O2 Saturation: 58.6 %
Patient temperature: 98.6
pCO2, Ven: 43.2 mmHg — ABNORMAL LOW (ref 44.0–60.0)
pH, Ven: 7.258 (ref 7.250–7.430)
pO2, Ven: 35.1 mmHg (ref 32.0–45.0)

## 2020-12-31 LAB — LACTIC ACID, PLASMA
Lactic Acid, Venous: 4.6 mmol/L (ref 0.5–1.9)
Lactic Acid, Venous: 6.7 mmol/L (ref 0.5–1.9)

## 2020-12-31 LAB — RESP PANEL BY RT-PCR (FLU A&B, COVID) ARPGX2
Influenza A by PCR: NEGATIVE
Influenza B by PCR: NEGATIVE
SARS Coronavirus 2 by RT PCR: NEGATIVE

## 2020-12-31 LAB — MRSA NEXT GEN BY PCR, NASAL: MRSA by PCR Next Gen: NOT DETECTED

## 2020-12-31 LAB — POCT I-STAT 7, (LYTES, BLD GAS, ICA,H+H)
Acid-base deficit: 9 mmol/L — ABNORMAL HIGH (ref 0.0–2.0)
Bicarbonate: 18.2 mmol/L — ABNORMAL LOW (ref 20.0–28.0)
Calcium, Ion: 1.17 mmol/L (ref 1.15–1.40)
HCT: 43 % (ref 39.0–52.0)
Hemoglobin: 14.6 g/dL (ref 13.0–17.0)
O2 Saturation: 100 %
Potassium: 4.9 mmol/L (ref 3.5–5.1)
Sodium: 133 mmol/L — ABNORMAL LOW (ref 135–145)
TCO2: 20 mmol/L — ABNORMAL LOW (ref 22–32)
pCO2 arterial: 43 mmHg (ref 32.0–48.0)
pH, Arterial: 7.235 — ABNORMAL LOW (ref 7.350–7.450)
pO2, Arterial: 414 mmHg — ABNORMAL HIGH (ref 83.0–108.0)

## 2020-12-31 LAB — ABO/RH: ABO/RH(D): O POS

## 2020-12-31 LAB — HIV ANTIBODY (ROUTINE TESTING W REFLEX): HIV Screen 4th Generation wRfx: NONREACTIVE

## 2020-12-31 SURGERY — LAPAROTOMY, EXPLORATORY
Anesthesia: General | Site: Abdomen

## 2020-12-31 MED ORDER — ONDANSETRON HCL 4 MG/2ML IJ SOLN: 4.0000 mg | Freq: Four times a day (QID) | INTRAMUSCULAR | Status: DC | PRN

## 2020-12-31 MED ORDER — SUCCINYLCHOLINE CHLORIDE 200 MG/10ML IV SOSY
PREFILLED_SYRINGE | INTRAVENOUS | Status: DC | PRN
Start: 2020-12-31 — End: 2020-12-31
  Administered 2020-12-31: 120 mg via INTRAVENOUS

## 2020-12-31 MED ORDER — PIPERACILLIN-TAZOBACTAM 3.375 G IVPB
3.3750 g | Freq: Three times a day (TID) | INTRAVENOUS | Status: AC
  Administered 2020-12-31 – 2021-01-04 (×12): 3.375 g via INTRAVENOUS
  Filled 2020-12-31 (×12): qty 50

## 2020-12-31 MED ORDER — PIPERACILLIN-TAZOBACTAM 3.375 G IVPB 30 MIN
3.3750 g | Freq: Once | INTRAVENOUS | Status: AC
  Administered 2020-12-31: 3.375 g via INTRAVENOUS
  Filled 2020-12-31 (×2): qty 50

## 2020-12-31 MED ORDER — CEFAZOLIN SODIUM-DEXTROSE 2-3 GM-%(50ML) IV SOLR
INTRAVENOUS | Status: DC | PRN
  Administered 2020-12-31: 2 g via INTRAVENOUS

## 2020-12-31 MED ORDER — MORPHINE SULFATE (PF) 4 MG/ML IV SOLN: 4.0000 mg | INTRAVENOUS | Status: DC | PRN

## 2020-12-31 MED ORDER — ALUM & MAG HYDROXIDE-SIMETH 200-200-20 MG/5ML PO SUSP: 30.0000 mL | Freq: Once | ORAL | Status: DC

## 2020-12-31 MED ORDER — PROPOFOL 1000 MG/100ML IV EMUL
0.0000 ug/kg/min | INTRAVENOUS | Status: DC
Start: 2020-12-31 — End: 2021-01-01
  Administered 2020-12-31: 40 ug/kg/min via INTRAVENOUS
  Administered 2020-12-31: 20 ug/kg/min via INTRAVENOUS
  Administered 2020-12-31: 40 ug/kg/min via INTRAVENOUS
  Administered 2020-12-31: 50 ug/kg/min via INTRAVENOUS
  Administered 2021-01-01: 20 ug/kg/min via INTRAVENOUS
  Filled 2020-12-31 (×3): qty 100

## 2020-12-31 MED ORDER — CHLORHEXIDINE GLUCONATE CLOTH 2 % EX PADS
6.0000 | MEDICATED_PAD | Freq: Every day | CUTANEOUS | Status: DC
  Administered 2021-01-01: 6 via TOPICAL

## 2020-12-31 MED ORDER — OXYCODONE-ACETAMINOPHEN 5-325 MG PO TABS
1.0000 | ORAL_TABLET | Freq: Once | ORAL | Status: AC
  Administered 2020-12-31: 1 via ORAL
  Filled 2020-12-31: qty 1

## 2020-12-31 MED ORDER — DEXAMETHASONE SODIUM PHOSPHATE 10 MG/ML IJ SOLN
INTRAMUSCULAR | Status: DC | PRN
  Administered 2020-12-31: 4 mg via INTRAVENOUS

## 2020-12-31 MED ORDER — PROPOFOL 10 MG/ML IV BOLUS
INTRAVENOUS | Status: DC | PRN
  Administered 2020-12-31: 150 mg via INTRAVENOUS

## 2020-12-31 MED ORDER — MIDAZOLAM HCL 2 MG/2ML IJ SOLN
INTRAMUSCULAR | Status: DC | PRN
Start: 2020-12-31 — End: 2020-12-31
  Administered 2020-12-31 (×2): 1 mg via INTRAVENOUS

## 2020-12-31 MED ORDER — LACTATED RINGERS IV BOLUS
1000.0000 mL | Freq: Once | INTRAVENOUS | Status: AC
  Administered 2020-12-31: 1000 mL via INTRAVENOUS

## 2020-12-31 MED ORDER — FENTANYL CITRATE PF 50 MCG/ML IJ SOSY: 25.0000 ug | PREFILLED_SYRINGE | INTRAMUSCULAR | Status: DC | PRN

## 2020-12-31 MED ORDER — ORAL CARE MOUTH RINSE
15.0000 mL | OROMUCOSAL | Status: DC
  Administered 2020-12-31 – 2021-01-01 (×7): 15 mL via OROMUCOSAL

## 2020-12-31 MED ORDER — POLYETHYLENE GLYCOL 3350 17 G PO PACK
17.0000 g | PACK | Freq: Every day | ORAL | Status: DC
  Administered 2021-01-01: 17 g
  Filled 2020-12-31: qty 1

## 2020-12-31 MED ORDER — ONDANSETRON HCL 4 MG/2ML IJ SOLN
INTRAMUSCULAR | Status: AC
  Filled 2020-12-31: qty 2

## 2020-12-31 MED ORDER — FENTANYL CITRATE (PF) 250 MCG/5ML IJ SOLN
INTRAMUSCULAR | Status: DC | PRN
  Administered 2020-12-31 (×4): 50 ug via INTRAVENOUS
  Administered 2020-12-31 (×3): 100 ug via INTRAVENOUS

## 2020-12-31 MED ORDER — MIDAZOLAM HCL 2 MG/2ML IJ SOLN
INTRAMUSCULAR | Status: AC
  Filled 2020-12-31: qty 2

## 2020-12-31 MED ORDER — LIDOCAINE 2% (20 MG/ML) 5 ML SYRINGE
INTRAMUSCULAR | Status: DC | PRN
Start: 2020-12-31 — End: 2020-12-31
  Administered 2020-12-31: 60 mg via INTRAVENOUS

## 2020-12-31 MED ORDER — FENTANYL CITRATE PF 50 MCG/ML IJ SOSY: 50.0000 ug | PREFILLED_SYRINGE | INTRAMUSCULAR | Status: DC | PRN

## 2020-12-31 MED ORDER — ROCURONIUM BROMIDE 10 MG/ML (PF) SYRINGE
PREFILLED_SYRINGE | INTRAVENOUS | Status: AC
  Filled 2020-12-31: qty 10

## 2020-12-31 MED ORDER — LACTATED RINGERS IV SOLN: INTRAVENOUS | Status: DC | PRN

## 2020-12-31 MED ORDER — FENTANYL CITRATE (PF) 250 MCG/5ML IJ SOLN
INTRAMUSCULAR | Status: AC
  Filled 2020-12-31: qty 5

## 2020-12-31 MED ORDER — PROPOFOL 500 MG/50ML IV EMUL
INTRAVENOUS | Status: DC | PRN
  Administered 2020-12-31: 50 ug/kg/min via INTRAVENOUS

## 2020-12-31 MED ORDER — OXYCODONE HCL 5 MG PO TABS
5.0000 mg | ORAL_TABLET | ORAL | Status: DC | PRN
  Administered 2021-01-01 (×2): 5 mg
  Administered 2021-01-01: 10 mg
  Filled 2020-12-31: qty 2
  Filled 2020-12-31: qty 1
  Filled 2020-12-31: qty 2

## 2020-12-31 MED ORDER — LORAZEPAM 1 MG PO TABS
2.0000 mg | ORAL_TABLET | Freq: Once | ORAL | Status: AC
  Administered 2020-12-31: 2 mg via ORAL
  Filled 2020-12-31: qty 2

## 2020-12-31 MED ORDER — SODIUM CHLORIDE 0.9 % IR SOLN
Status: DC | PRN
  Administered 2020-12-31: 2000 mL

## 2020-12-31 MED ORDER — FENTANYL CITRATE PF 50 MCG/ML IJ SOSY
50.0000 ug | PREFILLED_SYRINGE | INTRAMUSCULAR | Status: AC | PRN
  Administered 2020-12-31 – 2021-01-02 (×2): 50 ug via INTRAVENOUS
  Filled 2020-12-31: qty 1

## 2020-12-31 MED ORDER — ENOXAPARIN SODIUM 30 MG/0.3ML IJ SOSY: 30.0000 mg | PREFILLED_SYRINGE | INTRAMUSCULAR | Status: DC

## 2020-12-31 MED ORDER — DEXAMETHASONE SODIUM PHOSPHATE 10 MG/ML IJ SOLN
INTRAMUSCULAR | Status: AC
  Filled 2020-12-31: qty 1

## 2020-12-31 MED ORDER — SODIUM BICARBONATE 8.4 % IV SOLN
INTRAVENOUS | Status: DC | PRN
  Administered 2020-12-31: 50 meq via INTRAVENOUS

## 2020-12-31 MED ORDER — CHLORHEXIDINE GLUCONATE 0.12% ORAL RINSE (MEDLINE KIT)
15.0000 mL | Freq: Two times a day (BID) | OROMUCOSAL | Status: DC
  Administered 2020-12-31 – 2021-01-01 (×2): 15 mL via OROMUCOSAL

## 2020-12-31 MED ORDER — FENTANYL 2500MCG IN NS 250ML (10MCG/ML) PREMIX INFUSION
50.0000 ug/h | INTRAVENOUS | Status: DC
  Administered 2020-12-31: 50 ug/h via INTRAVENOUS
  Filled 2020-12-31: qty 250

## 2020-12-31 MED ORDER — ONDANSETRON 4 MG PO TBDP: 4.0000 mg | ORAL_TABLET | Freq: Four times a day (QID) | ORAL | Status: DC | PRN

## 2020-12-31 MED ORDER — HALOPERIDOL LACTATE 5 MG/ML IJ SOLN
5.0000 mg | INTRAMUSCULAR | Status: AC
  Administered 2020-12-31: 5 mg via INTRAMUSCULAR
  Filled 2020-12-31: qty 1

## 2020-12-31 MED ORDER — LACTATED RINGERS IV SOLN: INTRAVENOUS | Status: DC

## 2020-12-31 MED ORDER — DOCUSATE SODIUM 50 MG/5ML PO LIQD
100.0000 mg | Freq: Two times a day (BID) | ORAL | Status: DC
  Administered 2020-12-31 – 2021-01-01 (×2): 100 mg
  Filled 2020-12-31 (×2): qty 10

## 2020-12-31 MED ORDER — ROCURONIUM BROMIDE 10 MG/ML (PF) SYRINGE
PREFILLED_SYRINGE | INTRAVENOUS | Status: DC | PRN
Start: 2020-12-31 — End: 2020-12-31
  Administered 2020-12-31: 40 mg via INTRAVENOUS
  Administered 2020-12-31: 30 mg via INTRAVENOUS
  Administered 2020-12-31: 20 mg via INTRAVENOUS

## 2020-12-31 MED ORDER — LIDOCAINE VISCOUS HCL 2 % MT SOLN: 15.0000 mL | Freq: Once | OROMUCOSAL | Status: DC

## 2020-12-31 MED ORDER — PANTOPRAZOLE SODIUM 40 MG IV SOLR
40.0000 mg | Freq: Every day | INTRAVENOUS | Status: DC
  Administered 2020-12-31 – 2021-01-01 (×2): 40 mg via INTRAVENOUS
  Filled 2020-12-31 (×2): qty 40

## 2020-12-31 MED ORDER — OXYCODONE HCL 5 MG/5ML PO SOLN: 5.0000 mg | Freq: Once | ORAL | Status: DC | PRN

## 2020-12-31 MED ORDER — SODIUM BICARBONATE 8.4 % IV SOLN
50.0000 meq | Freq: Once | INTRAVENOUS | Status: DC
  Filled 2020-12-31: qty 50

## 2020-12-31 MED ORDER — CEFAZOLIN SODIUM-DEXTROSE 2-4 GM/100ML-% IV SOLN
INTRAVENOUS | Status: AC
  Filled 2020-12-31: qty 100

## 2020-12-31 MED ORDER — FENTANYL CITRATE PF 50 MCG/ML IJ SOSY
50.0000 ug | PREFILLED_SYRINGE | Freq: Once | INTRAMUSCULAR | Status: DC
  Filled 2020-12-31: qty 1

## 2020-12-31 MED ORDER — POTASSIUM CHLORIDE IN NACL 20-0.9 MEQ/L-% IV SOLN: INTRAVENOUS | Status: DC

## 2020-12-31 MED ORDER — ONDANSETRON HCL 4 MG/2ML IJ SOLN
INTRAMUSCULAR | Status: DC | PRN
  Administered 2020-12-31: 4 mg via INTRAVENOUS

## 2020-12-31 MED ORDER — ENOXAPARIN SODIUM 30 MG/0.3ML IJ SOSY
30.0000 mg | PREFILLED_SYRINGE | Freq: Two times a day (BID) | INTRAMUSCULAR | Status: DC
  Administered 2021-01-01 – 2021-01-17 (×28): 30 mg via SUBCUTANEOUS
  Filled 2020-12-31 (×29): qty 0.3

## 2020-12-31 MED ORDER — OXYCODONE HCL 5 MG PO TABS: 5.0000 mg | ORAL_TABLET | Freq: Once | ORAL | Status: DC | PRN

## 2020-12-31 MED ORDER — NALOXONE HCL 0.4 MG/ML IJ SOLN
0.4000 mg | Freq: Once | INTRAMUSCULAR | Status: AC
  Administered 2020-12-31: 0.4 mg via INTRAVENOUS
  Filled 2020-12-31: qty 1

## 2020-12-31 MED ORDER — ACETAMINOPHEN 500 MG PO TABS
1000.0000 mg | ORAL_TABLET | Freq: Four times a day (QID) | ORAL | Status: DC
  Administered 2020-12-31 – 2021-01-01 (×4): 1000 mg
  Filled 2020-12-31 (×4): qty 2

## 2020-12-31 MED ORDER — FENTANYL BOLUS VIA INFUSION
50.0000 ug | INTRAVENOUS | Status: DC | PRN
  Administered 2020-12-31 (×2): 100 ug via INTRAVENOUS
  Filled 2020-12-31: qty 100

## 2020-12-31 MED ORDER — MORPHINE SULFATE (PF) 2 MG/ML IV SOLN: 1.0000 mg | INTRAVENOUS | Status: DC | PRN

## 2020-12-31 MED ORDER — PROPOFOL 10 MG/ML IV BOLUS
INTRAVENOUS | Status: AC
  Filled 2020-12-31: qty 40

## 2020-12-31 SURGICAL SUPPLY — 43 items
APL SWBSTK 6 STRL LF DISP (MISCELLANEOUS) ×1
APPLICATOR COTTON TIP 6 STRL (MISCELLANEOUS) ×1 IMPLANT
APPLICATOR COTTON TIP 6IN STRL (MISCELLANEOUS) ×2 IMPLANT
BAG COUNTER SPONGE SURGICOUNT (BAG) IMPLANT
BAG SPNG CNTER NS LX DISP (BAG)
BLADE EXTENDED COATED 6.5IN (ELECTRODE) IMPLANT
BLADE HEX COATED 2.75 (ELECTRODE) ×2 IMPLANT
BNDG GAUZE ELAST 4 BULKY (GAUZE/BANDAGES/DRESSINGS) ×1 IMPLANT
COVER MAYO STAND STRL (DRAPES) IMPLANT
DRAPE LAPAROSCOPIC ABDOMINAL (DRAPES) ×2 IMPLANT
DRAPE WARM FLUID 44X44 (DRAPES) IMPLANT
DRSG PAD ABDOMINAL 8X10 ST (GAUZE/BANDAGES/DRESSINGS) ×2 IMPLANT
ELECT REM PT RETURN 15FT ADLT (MISCELLANEOUS) ×2 IMPLANT
GAUZE SPONGE 4X4 12PLY STRL (GAUZE/BANDAGES/DRESSINGS) ×2 IMPLANT
GLOVE SURG ENC MOIS LTX SZ7.5 (GLOVE) ×4 IMPLANT
GLOVE SURG UNDER POLY LF SZ7 (GLOVE) ×2 IMPLANT
GOWN STRL REUS W/ TWL XL LVL3 (GOWN DISPOSABLE) ×1 IMPLANT
GOWN STRL REUS W/TWL LRG LVL3 (GOWN DISPOSABLE) ×2 IMPLANT
GOWN STRL REUS W/TWL XL LVL3 (GOWN DISPOSABLE) ×4 IMPLANT
HANDLE SUCTION POOLE (INSTRUMENTS) IMPLANT
KIT BASIN OR (CUSTOM PROCEDURE TRAY) ×2 IMPLANT
KIT TURNOVER KIT A (KITS) IMPLANT
LIGASURE IMPACT 36 18CM CVD LR (INSTRUMENTS) ×1 IMPLANT
NS IRRIG 1000ML POUR BTL (IV SOLUTION) ×2 IMPLANT
PACK GENERAL/GYN (CUSTOM PROCEDURE TRAY) ×2 IMPLANT
RELOAD PROXIMATE 75MM BLUE (ENDOMECHANICALS) ×4 IMPLANT
RELOAD STAPLE 75 3.8 BLU REG (ENDOMECHANICALS) IMPLANT
SPONGE T-LAP 18X18 ~~LOC~~+RFID (SPONGE) IMPLANT
STAPLER GUN LINEAR PROX 60 (STAPLE) ×1 IMPLANT
STAPLER PROXIMATE 75MM BLUE (STAPLE) ×1 IMPLANT
STAPLER VISISTAT 35W (STAPLE) ×2 IMPLANT
SUCTION POOLE HANDLE (INSTRUMENTS)
SUT PDS AB 1 CTX 36 (SUTURE) IMPLANT
SUT SILK 2 0 (SUTURE)
SUT SILK 2 0 SH CR/8 (SUTURE) IMPLANT
SUT SILK 2-0 18XBRD TIE 12 (SUTURE) IMPLANT
SUT SILK 3 0 (SUTURE)
SUT SILK 3 0 SH CR/8 (SUTURE) IMPLANT
SUT SILK 3-0 18XBRD TIE 12 (SUTURE) IMPLANT
TOWEL OR 17X26 10 PK STRL BLUE (TOWEL DISPOSABLE) ×4 IMPLANT
TRAY FOLEY MTR SLVR 16FR STAT (SET/KITS/TRAYS/PACK) ×1 IMPLANT
WATER STERILE IRR 1000ML POUR (IV SOLUTION) ×2 IMPLANT
YANKAUER SUCT BULB TIP NO VENT (SUCTIONS) IMPLANT

## 2020-12-31 NOTE — Op Note (Signed)
12/31/2020  10:08 AM  PATIENT:  Keith Andrews  65 y.o. male  PRE-OPERATIVE DIAGNOSIS:  ABDOMINAL TRAUMA WITH PERITONITIS  POST-OPERATIVE DIAGNOSIS:  ABDOMINAL TRAUMA WITH PERITONITIS  PROCEDURE:  Procedure(s): EXPLORATORY LAPAROTOMY; SEGMENTAL SMALL BOWEL RESECTION (N/A)  SURGEON:  Surgeon(s) and Role:    * Griselda Miner, MD - Primary    * Abigail Miyamoto, MD  PHYSICIAN ASSISTANT:   ASSISTANTS: none   ANESTHESIA:   general  EBL:  50 mL   BLOOD ADMINISTERED:none  DRAINS: none   LOCAL MEDICATIONS USED:  NONE  SPECIMEN:  Source of Specimen:  segment of small bowel with perforation  DISPOSITION OF SPECIMEN:  PATHOLOGY  COUNTS:  YES  TOURNIQUET:  * No tourniquets in log *  DICTATION: .Dragon Dictation  After informed consent was obtained the patient was brought to the operating room and placed in the supine position on the operating table.  After adequate induction of general anesthesia the patient's abdomen was prepped with ChloraPrep, allowed to dry, and draped in usual sterile manner.  An appropriate timeout was performed.  A midline incision was made with a 10 blade knife.  The incision was carried through the skin and subcutaneous tissue sharply with the electrocautery until the linea alba was identified.  The linea alba was also incised with the electrocautery.  The preperitoneal space was probed bluntly with a hemostat until the peritoneum was opened and access was gained to the abdominal cavity.  The rest of the incision was opened under direct vision.  A large amount of bilious succus like material was evacuated from the abdomen.  The omentum was densely adherent to the anterior abdominal wall superiorly but the lower abdomen was free.  I was then able to reflect the omentum superiorly and identified the ligament of Treitz.  The small bowel was run from the ligament of Treitz to the ileocecal valve.  In the distal small bowel there was a 1 cm hole causing the  contamination.  The mesentery just above and below this area was opened sharply with the electrocautery.  A GIA-75 stapler was placed across the small bowel at each of these points, clamped, and fired thereby dividing the bowel between staple lines.  The mesentery to the perforated segment was taken down sharply with the LigaSure.  This segment was sent to pathology for further evaluation.  The proximal and distal segments approximated each other very easily.  A small opening was made on the antimesenteric surface of the small intestine near the 2 staple lines.  Each limb of a GIA-75 stapler was then placed down the appropriate limb of small bowel, clamped, and fired thereby creating a nice widely patent enteroenterostomy.  The common opening was closed with a firing of a TA 60 stapler.  The mesenteric defect was closed with interrupted 2-0 silk stitches.  The staple line was imbricated with multiple 2-0 silk Lembert stitches as well as a 2-0 silk crotch stitch.  Once this was accomplished the anastomosis was intact and appeared widely patent and healthy.  I tried to visualize the colon and I did not see any other abnormalities.  I could not get to the stomach due to the dense omental adhesions superiorly.  The liver and spleen seemed okay.  The abdomen is then irrigated with copious amounts of saline until the effluent was clear.  The fascia of the anterior abdominal wall was then closed with 2 running #1 double-stranded looped PDS sutures.  The subcutaneous tissue was packed with  moistened Kerlix gauze and sterile dressings were applied.  The patient tolerated the procedure well.  At the end of the case all needle sponge and instrument counts were correct.  The patient was then to remain intubated and will be taken to the ICU at [facility] and transferred to the trauma service for further resuscitation and management  PLAN OF CARE: Admit to inpatient   PATIENT DISPOSITION:  ICU - intubated and critically  ill.   Delay start of Pharmacological VTE agent (>24hrs) due to surgical blood loss or risk of bleeding: no

## 2020-12-31 NOTE — Progress Notes (Addendum)
Patient seen after arrival to [facility]. Intubated/sedated, not breathing over the vent. S/p exlap, SBR, skin left open, abd dressed. Cont current care, plan to extubate later today vs in AM. Orders placed for critical care management. Cont zosyn x4d.  Critical care time:  Diamantina Monks, MD General and Trauma Surgery [facility] Surgery

## 2020-12-31 NOTE — Anesthesia Preprocedure Evaluation (Signed)
Anesthesia Evaluation  Patient identified by MRN, date of birth, ID band Patient awake    Reviewed: Allergy & Precautions, H&P , NPO status , Patient's Chart, lab work & pertinent test results  Airway Mallampati: II   Neck ROM: full    Dental   Pulmonary Current Smoker,    breath sounds clear to auscultation       Cardiovascular hypertension,  Rhythm:regular Rate:Normal     Neuro/Psych    GI/Hepatic (+)     substance abuse  alcohol use,   Endo/Other    Renal/GU ARFRenal disease     Musculoskeletal   Abdominal   Peds  Hematology   Anesthesia Other Findings   Reproductive/Obstetrics                             Anesthesia Physical Anesthesia Plan  ASA: 2 and emergent  Anesthesia Plan: General   Post-op Pain Management:    Induction: Intravenous  PONV Risk Score and Plan: 1 and Ondansetron, Dexamethasone, Midazolam and Treatment may vary due to age or medical condition  Airway Management Planned: Oral ETT  Additional Equipment:   Intra-op Plan:   Post-operative Plan: Possible Post-op intubation/ventilation  Informed Consent: I have reviewed the patients History and Physical, chart, labs and discussed the procedure including the risks, benefits and alternatives for the proposed anesthesia with the patient or authorized representative who has indicated his/her understanding and acceptance.     Dental advisory given  Plan Discussed with: CRNA, Anesthesiologist and Surgeon  Anesthesia Plan Comments:         Anesthesia Quick Evaluation
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
## 2020-12-31 NOTE — Transfer of Care (Signed)
Immediate Anesthesia Transfer of Care Note  Patient: Keith Andrews  Procedure(s) Performed: EXPLORATORY LAPAROTOMY; SEGMENTAL SMALL BOWEL RESECTION (Abdomen)  Patient Location: PACU  Anesthesia Type:General  Level of Consciousness: unresponsive and Patient remains intubated per anesthesia plan  Airway & Oxygen Therapy: Patient placed on Ventilator (see vital sign flow sheet for setting)  Post-op Assessment: Report given to RN and Post -op Vital signs reviewed and stable  Post vital signs: Reviewed and stable  Last Vitals:  Vitals Value Taken Time  BP 147/94 12/31/20 1045  Temp 37.2 C 12/31/20 1031  Pulse 110 12/31/20 1045  Resp 16 12/31/20 1045  SpO2 99 % 12/31/20 1045  Vitals shown include unvalidated device data.  Last Pain:  Vitals:   12/31/20 0659  PainSc: 2          Complications: No notable events documented.

## 2020-12-31 NOTE — Progress Notes (Signed)
RT called to PACU to set up ventilator for a patient being transferred to [place]. He was set up on PRVC 550 16 +5 50%    Intubated with 7.5 ETT tube taped at 23 @ lip. Patient appears stable at this time. Rt will continue to monitor until patient is with [employer]

## 2020-12-31 NOTE — Progress Notes (Signed)
Pharmacy Antibiotic Note  Keith Andrews is a 65 y.o. male admitted on 12/31/2020 with  peritonitis .  Pharmacy has been consulted for Zosyn dosing x 5 days. AKI - SCr up to 1.9.  Plan: Zosyn 3.375g IV ( infusion) x1; then 3.375g IV q8h (4h infusion) Monitor clinical progress, c/s, renal function F/u de-escalation plan/LOT Pharmacy will s/o consult and follow peripherally   Height: 5\' 8"  (172.7 cm) Weight: 81.8 kg (180 lb 5.4 oz) IBW/kg (Calculated) : 68.4  Temp (24hrs), Avg:98.2 F (36.8 C), Min:97.6 F (36.4 C), Max:99 F (37.2 C)  Recent Labs  Lab 12/30/20 1855 12/31/20 0556 12/31/20 0558 12/31/20 0641 12/31/20 0819  WBC 10.7* 7.3  --   --   --   CREATININE 1.25*  --   --  1.90*  --   LATICACIDVEN  --   --  4.6*  --  6.7*    Estimated Creatinine Clearance: 39 mL/min (A) (by C-G formula based on SCr of 1.9 mg/dL (H)).    No Known Allergies   13/12/22, PharmD, BCPS Please check AMION for all [facility] contact numbers Clinical Pharmacist 12/31/2020 12:55 PM

## 2020-12-31 NOTE — Anesthesia Procedure Notes (Signed)
Arterial Line Insertion Start/End11/01/2021 9:20 AM, 12/31/2020 9:25 AM Performed by: Achille Rich, MD, anesthesiologist  Patient location: Pre-op. Preanesthetic checklist: patient identified, IV checked, site marked, risks and benefits discussed, surgical consent, monitors and equipment checked, pre-op evaluation, timeout performed and anesthesia consent Patient sedated Left, radial was placed Catheter size: 20 G Hand hygiene performed  and maximum sterile barriers used   Attempts: 1 Procedure performed without using ultrasound guided technique. Following insertion, dressing applied and Biopatch. Post procedure assessment: normal and unchanged

## 2020-12-31 NOTE — Progress Notes (Signed)
Belongings brought by [facility] with patient on admission:  1 pair black colored shoes 1 black head covering 1 pair white socks 1 tan shirt  1 pair blue jeans 1 black belt 1 black wallet including 4 mastercards, 1 visa card, 1 card with [place] flag, ID card, and social security card.   Drug paraphernalia found in belongings that appears to be an  crack pipe. Given to hospital security staff (Officer Marlin Canary)

## 2020-12-31 NOTE — ED Triage Notes (Signed)
Patient checks back in after being evaluated and discharged. Patient never left lobby. Patient's ride arrived to take him home, and he refused to leave stating he was still in too much pain so he checked back in. Patient is ambulatory with a steady gait, complaining of continuing abdominal pain.
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
## 2020-12-31 NOTE — Anesthesia Procedure Notes (Signed)
Procedure Name: Intubation Date/Time: 12/31/2020 9:03 AM Performed by: Eben Burow, CRNA Pre-anesthesia Checklist: Patient identified, Emergency Drugs available, Suction available, Patient being monitored and Timeout performed Patient Re-evaluated:Patient Re-evaluated prior to induction Oxygen Delivery Method: Circle system utilized Preoxygenation: Pre-oxygenation with 100% oxygen Induction Type: IV induction and Rapid sequence Laryngoscope Size: Mac and 4 Grade View: Grade I Tube type: Oral Tube size: 7.5 mm Number of attempts: 1 Airway Equipment and Method: Stylet Placement Confirmation: ETT inserted through vocal cords under direct vision, positive ETCO2 and breath sounds checked- equal and bilateral Secured at: 22 cm Tube secured with: Tape Dental Injury: Teeth and Oropharynx as per pre-operative assessment

## 2021-01-01 LAB — CBC
HCT: 35.9 % — ABNORMAL LOW (ref 39.0–52.0)
Hemoglobin: 12.4 g/dL — ABNORMAL LOW (ref 13.0–17.0)
MCH: 31.8 pg (ref 26.0–34.0)
MCHC: 34.5 g/dL (ref 30.0–36.0)
MCV: 92.1 fL (ref 80.0–100.0)
Platelets: 190 10*3/uL (ref 150–400)
RBC: 3.9 MIL/uL — ABNORMAL LOW (ref 4.22–5.81)
RDW: 12.3 % (ref 11.5–15.5)
WBC: 9.4 10*3/uL (ref 4.0–10.5)
nRBC: 0 % (ref 0.0–0.2)

## 2021-01-01 LAB — COMPREHENSIVE METABOLIC PANEL
ALT: 53 U/L — ABNORMAL HIGH (ref 0–44)
AST: 59 U/L — ABNORMAL HIGH (ref 15–41)
Albumin: 2.2 g/dL — ABNORMAL LOW (ref 3.5–5.0)
Alkaline Phosphatase: 40 U/L (ref 38–126)
Anion gap: 10 (ref 5–15)
BUN: 20 mg/dL (ref 8–23)
CO2: 21 mmol/L — ABNORMAL LOW (ref 22–32)
Calcium: 7.7 mg/dL — ABNORMAL LOW (ref 8.9–10.3)
Chloride: 101 mmol/L (ref 98–111)
Creatinine, Ser: 1.74 mg/dL — ABNORMAL HIGH (ref 0.61–1.24)
GFR, Estimated: 44 mL/min — ABNORMAL LOW (ref >60)
Glucose, Bld: 134 mg/dL — ABNORMAL HIGH (ref 70–99)
Potassium: 4.3 mmol/L (ref 3.5–5.1)
Sodium: 132 mmol/L — ABNORMAL LOW (ref 135–145)
Total Bilirubin: 0.6 mg/dL (ref 0.3–1.2)
Total Protein: 5.5 g/dL — ABNORMAL LOW (ref 6.5–8.1)

## 2021-01-01 LAB — GLUCOSE, CAPILLARY: Glucose-Capillary: 112 mg/dL — ABNORMAL HIGH (ref 70–99)

## 2021-01-01 LAB — TRIGLYCERIDES: Triglycerides: 170 mg/dL — ABNORMAL HIGH (ref <150)

## 2021-01-01 MED ORDER — POLYETHYLENE GLYCOL 3350 17 G PO PACK
17.0000 g | PACK | Freq: Every day | ORAL | Status: DC
  Administered 2021-01-03 – 2021-01-16 (×10): 17 g via ORAL
  Filled 2021-01-01 (×10): qty 1

## 2021-01-01 MED ORDER — OXYCODONE HCL 5 MG PO TABS
5.0000 mg | ORAL_TABLET | ORAL | Status: DC | PRN
  Administered 2021-01-01 – 2021-01-09 (×12): 10 mg via ORAL
  Administered 2021-01-10: 5 mg via ORAL
  Administered 2021-01-11 – 2021-01-13 (×7): 10 mg via ORAL
  Administered 2021-01-14: 5 mg via ORAL
  Administered 2021-01-16: 11:00:00 10 mg via ORAL
  Filled 2021-01-01: qty 1
  Filled 2021-01-01 (×4): qty 2
  Filled 2021-01-01: qty 1
  Filled 2021-01-01 (×15): qty 2

## 2021-01-01 MED ORDER — DOCUSATE SODIUM 100 MG PO CAPS
100.0000 mg | ORAL_CAPSULE | Freq: Two times a day (BID) | ORAL | Status: DC
  Administered 2021-01-01 – 2021-01-17 (×25): 100 mg via ORAL
  Filled 2021-01-01 (×29): qty 1

## 2021-01-01 MED ORDER — METHOCARBAMOL 500 MG PO TABS
1000.0000 mg | ORAL_TABLET | Freq: Three times a day (TID) | ORAL | Status: DC
  Administered 2021-01-01 – 2021-01-17 (×42): 1000 mg via ORAL
  Filled 2021-01-01 (×46): qty 2

## 2021-01-01 MED ORDER — ORAL CARE MOUTH RINSE
15.0000 mL | Freq: Two times a day (BID) | OROMUCOSAL | Status: DC
  Administered 2021-01-01 (×2): 15 mL via OROMUCOSAL

## 2021-01-01 MED ORDER — MORPHINE SULFATE (PF) 2 MG/ML IV SOLN
2.0000 mg | INTRAVENOUS | Status: DC | PRN
Start: 2021-01-01 — End: 2021-01-10
  Administered 2021-01-01 – 2021-01-03 (×5): 2 mg via INTRAVENOUS
  Administered 2021-01-03: 3 mg via INTRAVENOUS
  Administered 2021-01-03: 2 mg via INTRAVENOUS
  Administered 2021-01-04 – 2021-01-09 (×3): 4 mg via INTRAVENOUS
  Filled 2021-01-01: qty 1
  Filled 2021-01-01: qty 2
  Filled 2021-01-01 (×2): qty 1
  Filled 2021-01-01: qty 2
  Filled 2021-01-01 (×2): qty 1
  Filled 2021-01-01 (×3): qty 2

## 2021-01-01 MED ORDER — ACETAMINOPHEN 500 MG PO TABS
1000.0000 mg | ORAL_TABLET | Freq: Four times a day (QID) | ORAL | Status: DC
  Administered 2021-01-01 – 2021-01-17 (×46): 1000 mg via ORAL
  Filled 2021-01-01 (×55): qty 2

## 2021-01-01 MED ORDER — METHOCARBAMOL 500 MG PO TABS
1000.0000 mg | ORAL_TABLET | Freq: Three times a day (TID) | ORAL | Status: DC
  Administered 2021-01-01: 1000 mg
  Filled 2021-01-01: qty 2

## 2021-01-01 NOTE — Procedures (Signed)
Extubation Procedure Note  Patient Details:   Name: Keith Andrews DOB: 1955/11/25 MRN: [id number]   Airway Documentation:    Vent end date: 01/01/21 Vent end time: 0850   Evaluation  O2 sats: stable throughout Complications: No apparent complications Patient did tolerate procedure well. Bilateral Breath Sounds: Rhonchi   Yes  Pt extubated to 5l [place]. Cuff leak present, no stridor noted, pt tolerating well at this time. RN at bedside, MD aware, RT will continue to monitor.   Rosalita Levan 01/01/2021, 8:50 AM

## 2021-01-01 NOTE — Progress Notes (Signed)
1430: This RN and two other RNs assisted pt from bed to chair  1700: RN entered room to find pt. standing, attempting to move from chair to bed by himself. NG tube removed by pt. at this time. Pt. assisted back to bed without incident. BP elevated and pain medication given due to patient grimacing. Dr. Bedelia Person notified of NG removal by pt. RN instructed not to reinsert NG at this time.

## 2021-01-02 ENCOUNTER — Encounter (HOSPITAL_COMMUNITY): Payer: Self-pay | Admitting: General Surgery

## 2021-01-02 LAB — CBC
HCT: 35.4 % — ABNORMAL LOW (ref 39.0–52.0)
Hemoglobin: 12 g/dL — ABNORMAL LOW (ref 13.0–17.0)
MCH: 31.8 pg (ref 26.0–34.0)
MCHC: 33.9 g/dL (ref 30.0–36.0)
MCV: 93.9 fL (ref 80.0–100.0)
Platelets: 195 10*3/uL (ref 150–400)
RBC: 3.77 MIL/uL — ABNORMAL LOW (ref 4.22–5.81)
RDW: 12.4 % (ref 11.5–15.5)
WBC: 9.9 10*3/uL (ref 4.0–10.5)
nRBC: 0 % (ref 0.0–0.2)

## 2021-01-02 LAB — BASIC METABOLIC PANEL
Anion gap: 9 (ref 5–15)
BUN: 14 mg/dL (ref 8–23)
CO2: 22 mmol/L (ref 22–32)
Calcium: 8.2 mg/dL — ABNORMAL LOW (ref 8.9–10.3)
Chloride: 103 mmol/L (ref 98–111)
Creatinine, Ser: 1.37 mg/dL — ABNORMAL HIGH (ref 0.61–1.24)
GFR, Estimated: 58 mL/min — ABNORMAL LOW (ref >60)
Glucose, Bld: 127 mg/dL — ABNORMAL HIGH (ref 70–99)
Potassium: 3.8 mmol/L (ref 3.5–5.1)
Sodium: 134 mmol/L — ABNORMAL LOW (ref 135–145)

## 2021-01-02 MED ORDER — DEXMEDETOMIDINE HCL IN NACL 400 MCG/100ML IV SOLN
0.4000 ug/kg/h | INTRAVENOUS | Status: DC
  Administered 2021-01-02: 0.4 ug/kg/h via INTRAVENOUS
  Filled 2021-01-02: qty 100

## 2021-01-02 MED ORDER — THIAMINE HCL 100 MG/ML IJ SOLN
100.0000 mg | Freq: Every day | INTRAMUSCULAR | Status: DC
  Filled 2021-01-02: qty 2

## 2021-01-02 MED ORDER — LORAZEPAM 2 MG/ML IJ SOLN
1.0000 mg | INTRAMUSCULAR | Status: AC | PRN
  Administered 2021-01-02: 2 mg via INTRAVENOUS
  Administered 2021-01-02 – 2021-01-03 (×2): 1 mg via INTRAVENOUS
  Administered 2021-01-03 – 2021-01-04 (×3): 2 mg via INTRAVENOUS
  Filled 2021-01-02 (×6): qty 1
  Filled 2021-01-02: qty 2
  Filled 2021-01-02: qty 1

## 2021-01-02 MED ORDER — FOLIC ACID 1 MG PO TABS
1.0000 mg | ORAL_TABLET | Freq: Every day | ORAL | Status: DC
  Administered 2021-01-02 – 2021-01-17 (×16): 1 mg via ORAL
  Filled 2021-01-02 (×16): qty 1

## 2021-01-02 MED ORDER — SPIRITUS FRUMENTI
1.0000 | Freq: Two times a day (BID) | ORAL | Status: DC
  Administered 2021-01-02 – 2021-01-10 (×8): 1 via ORAL
  Filled 2021-01-02 (×19): qty 1

## 2021-01-02 MED ORDER — HALOPERIDOL LACTATE 5 MG/ML IJ SOLN: 10.0000 mg | Freq: Four times a day (QID) | INTRAMUSCULAR | Status: DC | PRN

## 2021-01-02 MED ORDER — ADULT MULTIVITAMIN W/MINERALS CH
1.0000 | ORAL_TABLET | Freq: Every day | ORAL | Status: DC
  Administered 2021-01-02 – 2021-01-17 (×16): 1 via ORAL
  Filled 2021-01-02 (×16): qty 1

## 2021-01-02 MED ORDER — METOPROLOL TARTRATE 5 MG/5ML IV SOLN
5.0000 mg | Freq: Four times a day (QID) | INTRAVENOUS | Status: DC | PRN
  Administered 2021-01-02: 5 mg via INTRAVENOUS
  Filled 2021-01-02 (×2): qty 5

## 2021-01-02 MED ORDER — HALOPERIDOL LACTATE 5 MG/ML IJ SOLN
5.0000 mg | Freq: Once | INTRAMUSCULAR | Status: AC
Start: 2021-01-02 — End: 2021-01-02

## 2021-01-02 MED ORDER — HALOPERIDOL LACTATE 5 MG/ML IJ SOLN
INTRAMUSCULAR | Status: AC
  Administered 2021-01-02: 5 mg via INTRAVENOUS
  Filled 2021-01-02: qty 1

## 2021-01-02 MED ORDER — LORAZEPAM 2 MG/ML IJ SOLN
1.0000 mg | Freq: Once | INTRAMUSCULAR | Status: AC
  Administered 2021-01-02: 1 mg via INTRAVENOUS
  Filled 2021-01-02: qty 1

## 2021-01-02 MED ORDER — HALOPERIDOL 5 MG PO TABS
5.0000 mg | ORAL_TABLET | Freq: Three times a day (TID) | ORAL | Status: DC
  Administered 2021-01-02 – 2021-01-07 (×16): 5 mg via ORAL
  Filled 2021-01-02: qty 1
  Filled 2021-01-02 (×3): qty 5
  Filled 2021-01-02 (×5): qty 1
  Filled 2021-01-02: qty 5
  Filled 2021-01-02: qty 1
  Filled 2021-01-02 (×2): qty 5
  Filled 2021-01-02 (×2): qty 1
  Filled 2021-01-02 (×3): qty 5
  Filled 2021-01-02 (×2): qty 1

## 2021-01-02 MED ORDER — HALOPERIDOL 5 MG PO TABS
10.0000 mg | ORAL_TABLET | Freq: Once | ORAL | Status: AC
  Filled 2021-01-02: qty 2

## 2021-01-02 MED ORDER — HALOPERIDOL LACTATE 5 MG/ML IJ SOLN
10.0000 mg | Freq: Four times a day (QID) | INTRAMUSCULAR | Status: DC | PRN
  Administered 2021-01-04 – 2021-01-09 (×4): 10 mg via INTRAVENOUS
  Filled 2021-01-02 (×4): qty 2

## 2021-01-02 MED ORDER — LORAZEPAM 1 MG PO TABS
1.0000 mg | ORAL_TABLET | ORAL | Status: AC | PRN
  Administered 2021-01-02: 3 mg via ORAL
  Administered 2021-01-04: 2 mg via ORAL
  Filled 2021-01-02: qty 3

## 2021-01-02 MED ORDER — HALOPERIDOL LACTATE 5 MG/ML IJ SOLN
10.0000 mg | Freq: Once | INTRAMUSCULAR | Status: AC
  Administered 2021-01-02: 10 mg via INTRAMUSCULAR
  Filled 2021-01-02: qty 2

## 2021-01-02 MED ORDER — POTASSIUM CHLORIDE 10 MEQ/100ML IV SOLN
10.0000 meq | INTRAVENOUS | Status: AC
  Administered 2021-01-02 (×2): 10 meq via INTRAVENOUS
  Filled 2021-01-02 (×2): qty 100

## 2021-01-02 MED ORDER — THIAMINE HCL 100 MG PO TABS
100.0000 mg | ORAL_TABLET | Freq: Every day | ORAL | Status: DC
  Administered 2021-01-02 – 2021-01-17 (×16): 100 mg via ORAL
  Filled 2021-01-02 (×16): qty 1

## 2021-01-02 NOTE — Anesthesia Postprocedure Evaluation (Signed)
Anesthesia Post Note  Patient: Keith Andrews  Procedure(s) Performed: EXPLORATORY LAPAROTOMY; SEGMENTAL SMALL BOWEL RESECTION (Abdomen)     Patient location during evaluation: PACU Anesthesia Type: General Level of consciousness: sedated Pain management: pain level controlled Vital Signs Assessment: post-procedure vital signs reviewed and stable Respiratory status: patient remains intubated per anesthesia plan Cardiovascular status: stable Postop Assessment: no apparent nausea or vomiting Anesthetic complications: no Comments: Pt was transferred to ICU at [facility] via carelink   No notable events documented.  Last Vitals:  Vitals:   01/02/21 0400 01/02/21 0600  BP: (!) 162/90 (!) 149/110  Pulse: (!) 124 (!) 109  Resp:  14  Temp: 36.4 C   SpO2: (!) 87% 91%    Last Pain:  Vitals:   01/02/21 0419  TempSrc:   PainSc: Asleep                 Christiano Blandon S

## 2021-01-02 NOTE — Progress Notes (Signed)
PT Cancellation Note  Patient Details Name: Keith Andrews MRN: [id number] DOB: 16-Jan-1956   Cancelled Treatment:    Reason Eval/Treat Not Completed: Fatigue/lethargy limiting ability to participate - pt on haldol and precedex, currently too lethargic to participate in therapy. Will check back.  Marye Round, PT DPT Acute Rehabilitation Services Pager [phone]  Office [phone]    Truddie Coco 01/02/2021, 9:39 AM
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
## 2021-01-02 NOTE — Consult Note (Signed)
WOC Nurse Consult Note: Patient receiving care in [facility] 4N24. Primary RN present during [facility] application. Reason for Consult: Midline VAC Wound type: surgical Pressure Injury POA: Yes/No/NA Measurement: 19 cm x 3.8 cm x 3.8 cm (deepest at base of wound) Wound bed: light pink Drainage (amount, consistency, odor) serosanginous on existing gauze dressing Periwound: intact Dressing procedure/placement/frequency: One piece of black foam cut to fit into incision wound. Drape applied, immediate seal obtained. Patient did not appear uncomfortable during procedure. Due to the straightforward, uncomplicated nature of the [facility] application, I have turned this over to the bedside RNs. WOC nurse will not follow at this time.  Please re-consult the WOC team if needed.  Helmut Muster, RN, MSN, CWOCN, CNS-BC, pager [phone]

## 2021-01-02 NOTE — Progress Notes (Addendum)
11:43 01/02/21 Notified Dr. Andrey Campanile of bp 194/93 (123) and HR 95. Lopressor 5 mg given  2:16 11/15 paged Dr. Andrey Campanile again, current bp 210/100 (126) and HR 91; awaiting orders; Dr. Andrey Campanile at bedside.  Lopressor given again  5:07 paged Dr. Andrey Campanile again; lopressor given and bp increased; awaiting orders

## 2021-01-02 NOTE — TOC CAGE-AID Note (Signed)
Transition of Care [facility]) - CAGE-AID Screening   Patient Details  Name: Keith Andrews MRN: [id number] Date of Birth: [birth year]  Transition [facility]) CM/SW Contact:    Marcello Tuzzolino C Tarpley-Carter, LCSWA Phone Number: 01/02/2021, 11:49 AM   Clinical Narrative: Pt is unable to participate in Cage Aid.  Pt is currently onpt on haldol and precedex.  Tamee Battin Tarpley-Carter, MSW, LCSW-A Pronouns:  She/Her/Hers [facility] Clinical Social Worker Direct Number:  [phone] [email or web address]    CAGE-AID Screening: Substance Abuse Screening unable to be completed due to: : Patient unable to participate (Pt is currently onpt on haldol and precedex.)             Substance Abuse Education Offered: No  Substance abuse interventions: Transport planner
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
## 2021-01-03 LAB — CBC
HCT: 35.4 % — ABNORMAL LOW (ref 39.0–52.0)
Hemoglobin: 11.4 g/dL — ABNORMAL LOW (ref 13.0–17.0)
MCH: 30.9 pg (ref 26.0–34.0)
MCHC: 32.2 g/dL (ref 30.0–36.0)
MCV: 95.9 fL (ref 80.0–100.0)
Platelets: 227 10*3/uL (ref 150–400)
RBC: 3.69 MIL/uL — ABNORMAL LOW (ref 4.22–5.81)
RDW: 12.3 % (ref 11.5–15.5)
WBC: 8.6 10*3/uL (ref 4.0–10.5)
nRBC: 0 % (ref 0.0–0.2)

## 2021-01-03 LAB — BASIC METABOLIC PANEL
Anion gap: 10 (ref 5–15)
BUN: 10 mg/dL (ref 8–23)
CO2: 23 mmol/L (ref 22–32)
Calcium: 8.4 mg/dL — ABNORMAL LOW (ref 8.9–10.3)
Chloride: 103 mmol/L (ref 98–111)
Creatinine, Ser: 1.23 mg/dL (ref 0.61–1.24)
GFR, Estimated: >60 mL/min (ref >60)
Glucose, Bld: 100 mg/dL — ABNORMAL HIGH (ref 70–99)
Potassium: 3.7 mmol/L (ref 3.5–5.1)
Sodium: 136 mmol/L (ref 135–145)

## 2021-01-03 LAB — MAGNESIUM: Magnesium: 2.1 mg/dL (ref 1.7–2.4)

## 2021-01-03 LAB — GLUCOSE, CAPILLARY: Glucose-Capillary: 149 mg/dL — ABNORMAL HIGH (ref 70–99)

## 2021-01-03 LAB — SURGICAL PATHOLOGY

## 2021-01-03 MED ORDER — LABETALOL HCL 5 MG/ML IV SOLN
5.0000 mg | INTRAVENOUS | Status: DC | PRN
  Administered 2021-01-03 (×2): 5 mg via INTRAVENOUS
  Filled 2021-01-03 (×3): qty 4

## 2021-01-03 MED ORDER — LABETALOL HCL 5 MG/ML IV SOLN
10.0000 mg | INTRAVENOUS | Status: AC | PRN
  Administered 2021-01-04 (×2): 10 mg via INTRAVENOUS
  Filled 2021-01-03 (×2): qty 4

## 2021-01-03 MED ORDER — HYDRALAZINE HCL 10 MG PO TABS
10.0000 mg | ORAL_TABLET | Freq: Four times a day (QID) | ORAL | Status: DC | PRN
  Administered 2021-01-03 – 2021-01-06 (×3): 10 mg via ORAL
  Filled 2021-01-03 (×3): qty 1

## 2021-01-03 MED ORDER — LABETALOL HCL 100 MG PO TABS
100.0000 mg | ORAL_TABLET | Freq: Two times a day (BID) | ORAL | Status: DC
  Administered 2021-01-03 – 2021-01-17 (×29): 100 mg via ORAL
  Filled 2021-01-03 (×29): qty 1

## 2021-01-03 MED ORDER — AMLODIPINE BESYLATE 10 MG PO TABS
10.0000 mg | ORAL_TABLET | Freq: Every day | ORAL | Status: DC
  Administered 2021-01-03 – 2021-01-17 (×15): 10 mg via ORAL
  Filled 2021-01-03 (×15): qty 1

## 2021-01-03 MED ORDER — HALOPERIDOL 1 MG PO TABS
4.0000 mg | ORAL_TABLET | Freq: Once | ORAL | Status: DC
  Filled 2021-01-03: qty 4

## 2021-01-03 MED ORDER — LABETALOL HCL 5 MG/ML IV SOLN
5.0000 mg | INTRAVENOUS | Status: AC | PRN
  Administered 2021-01-03 (×2): 5 mg via INTRAVENOUS
  Filled 2021-01-03 (×2): qty 4
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
## 2021-01-03 NOTE — Progress Notes (Signed)
   01/03/21 0034  Assess: MEWS Score  Temp 98.7 F (37.1 C)  BP (!) 204/97  Pulse Rate 88  ECG Heart Rate 89  Resp (!) 21  Level of Consciousness Alert  SpO2 94 %  O2 Device Nasal Cannula  O2 Flow Rate (L/min) 2 L/min  Assess: MEWS Score  MEWS Temp 0  MEWS Systolic 2  MEWS Pulse 0  MEWS RR 1  MEWS LOC 0  MEWS Score 3  MEWS Score Color Yellow  Assess: if the MEWS score is Yellow or Red  Were vital signs taken at a resting state? Yes  Focused Assessment No change from prior assessment  Early Detection of Sepsis Score *See Row Information* Low  MEWS guidelines implemented *See Row Information* Yes  Treat  Pain Scale PAINAD  Pain Score 7  Breathing 1  Negative Vocalization 2  Facial Expression 1  Body Language 1  Consolability 2  PAINAD Score 7  Take Vital Signs  Increase Vital Sign Frequency  Yellow: Q 2hr X 2 then Q 4hr X 2, if remains yellow, continue Q 4hrs  Escalate  MEWS: Escalate Yellow: discuss with charge nurse/RN and consider discussing with provider and RRT  Notify: Charge Nurse/RN  Name of Charge Nurse/RN Notified Gladys, RN  Date Charge Nurse/RN Notified 01/03/21  Time Charge Nurse/RN Notified 0058  Notify: Provider  Provider Name/Title Dr. Andrey Campanile  Date Provider Notified 01/03/21  Time Provider Notified [phone]  Notification Type Page  Notification Reason Other (Comment) (PRN med requested)  Document  Patient Outcome Stabilized after interventions  Progress note created (see row info) Yes
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
## 2021-01-03 NOTE — Progress Notes (Signed)
   01/03/21 0206  Assess: MEWS Score  Temp 98.7 F (37.1 C)  BP (!) 210/100  Pulse Rate 90  ECG Heart Rate 91  Resp 18  Level of Consciousness Alert  SpO2 96 %  O2 Device Nasal Cannula  O2 Flow Rate (L/min) 2 L/min  Assess: MEWS Score  MEWS Temp 0  MEWS Systolic 2  MEWS Pulse 0  MEWS RR 0  MEWS LOC 0  MEWS Score 2  MEWS Score Color Yellow  Assess: if the MEWS score is Yellow or Red  Were vital signs taken at a resting state? Yes  Focused Assessment No change from prior assessment  Early Detection of Sepsis Score *See Row Information* Low  MEWS guidelines implemented *See Row Information* Yes  Treat  MEWS Interventions Administered scheduled meds/treatments  Pain Score Asleep  Take Vital Signs  Increase Vital Sign Frequency  Yellow: Q 2hr X 2 then Q 4hr X 2, if remains yellow, continue Q 4hrs  Escalate  MEWS: Escalate Yellow: discuss with charge nurse/RN and consider discussing with provider and RRT  Notify: Charge Nurse/RN  Name of Charge Nurse/RN Notified Gladys, RN  Date Charge Nurse/RN Notified 01/03/21  Time Charge Nurse/RN Notified 0221  Notify: Provider  Provider Name/Title Dr. Andrey Campanile  Date Provider Notified 01/03/21  Time Provider Notified 0221  Notification Type Face-to-face  Notification Reason Other (Comment)  Document  Progress note created (see row info) Yes
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
## 2021-01-03 NOTE — Progress Notes (Signed)
   01/03/21 0501  Assess: MEWS Score  BP (!) 202/93  Pulse Rate 88  ECG Heart Rate 88  Resp 18  SpO2 98 %  O2 Device Nasal Cannula  O2 Flow Rate (L/min) 2 L/min  Assess: MEWS Score  MEWS Temp 0  MEWS Systolic 2  MEWS Pulse 0  MEWS RR 0  MEWS LOC 1  MEWS Score 3  MEWS Score Color Yellow  Assess: if the MEWS score is Yellow or Red  Were vital signs taken at a resting state? Yes  Focused Assessment No change from prior assessment  Early Detection of Sepsis Score *See Row Information* Low  MEWS guidelines implemented *See Row Information* Yes  Treat  MEWS Interventions Administered prn meds/treatments;Escalated (See documentation below)  Pain Score Asleep  Take Vital Signs  Increase Vital Sign Frequency  Yellow: Q 2hr X 2 then Q 4hr X 2, if remains yellow, continue Q 4hrs  Escalate  MEWS: Escalate Yellow: discuss with charge nurse/RN and consider discussing with provider and RRT  Notify: Charge Nurse/RN  Name of Charge Nurse/RN Notified Gladys, RN  Date Charge Nurse/RN Notified 01/03/21  Time Charge Nurse/RN Notified 1829  Notify: Provider  Provider Name/Title Dr. Andrey Campanile  Date Provider Notified 01/03/21  Time Provider Notified [phone]  Notification Type Page  Document  Progress note created (see row info) Yes

## 2021-01-03 NOTE — Progress Notes (Signed)
Noted patient's continued hypertension on TRN rounds. BP 203/93. Dr. Dossie Der notified via text. New PRNS for hydralazine and labetalol placed by MD.

## 2021-01-04 ENCOUNTER — Inpatient Hospital Stay (HOSPITAL_COMMUNITY): Payer: Self-pay

## 2021-01-04 MED ORDER — CLONIDINE HCL 0.1 MG PO TABS
0.2000 mg | ORAL_TABLET | Freq: Two times a day (BID) | ORAL | Status: DC
  Administered 2021-01-04 – 2021-01-07 (×8): 0.2 mg via ORAL
  Filled 2021-01-04 (×8): qty 2

## 2021-01-04 MED ORDER — BOOST / RESOURCE BREEZE PO LIQD CUSTOM
1.0000 | Freq: Three times a day (TID) | ORAL | Status: DC
  Administered 2021-01-04 – 2021-01-06 (×5): 1 via ORAL

## 2021-01-04 NOTE — Progress Notes (Signed)
Patient is very angry and kicking at staff. I paged doctor for more sedatives and four point restraints.

## 2021-01-04 NOTE — Progress Notes (Signed)
Patient is still combative and agitated. He had a CT done this morning and the wound vac dressing was changed around 2 pm by another nurse and me. There is a big piece on the top and a small triangle piece, about 1 inch on the bottom. So two pieces all together. He was given 4 mg of morphine for pain, tolerated well. But still confused and combative during the dressing change.
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
## 2021-01-04 NOTE — Progress Notes (Signed)
Attempted x 2 to contact family member via telephone without success
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
## 2021-01-04 NOTE — Progress Notes (Signed)
Attempted to call all family members listed with no success

## 2021-01-04 NOTE — Consult Note (Signed)
Patient and primary nurse off of floor. I spoke via telephone with the primary nurse, Almira Coaster, and explained that the [facility] dressing was turned over to bedside staff to perform MWF. I encouraged her to reach out to the [facility] nurses if she had any questions or needed guidance on performing the dressing change. She expressed her understanding and appreciation. Helmut Muster, RN, MSN, CWOCN, CNS-BC, pager [phone]

## 2021-01-05 LAB — BASIC METABOLIC PANEL
Anion gap: 9 (ref 5–15)
BUN: 6 mg/dL — ABNORMAL LOW (ref 8–23)
CO2: 22 mmol/L (ref 22–32)
Calcium: 8.4 mg/dL — ABNORMAL LOW (ref 8.9–10.3)
Chloride: 102 mmol/L (ref 98–111)
Creatinine, Ser: 1.1 mg/dL (ref 0.61–1.24)
GFR, Estimated: >60 mL/min (ref >60)
Glucose, Bld: 104 mg/dL — ABNORMAL HIGH (ref 70–99)
Potassium: 3.4 mmol/L — ABNORMAL LOW (ref 3.5–5.1)
Sodium: 133 mmol/L — ABNORMAL LOW (ref 135–145)

## 2021-01-05 LAB — CBC
HCT: 33.5 % — ABNORMAL LOW (ref 39.0–52.0)
Hemoglobin: 11.2 g/dL — ABNORMAL LOW (ref 13.0–17.0)
MCH: 30.6 pg (ref 26.0–34.0)
MCHC: 33.4 g/dL (ref 30.0–36.0)
MCV: 91.5 fL (ref 80.0–100.0)
Platelets: 264 10*3/uL (ref 150–400)
RBC: 3.66 MIL/uL — ABNORMAL LOW (ref 4.22–5.81)
RDW: 11.9 % (ref 11.5–15.5)
WBC: 6.7 10*3/uL (ref 4.0–10.5)
nRBC: 0 % (ref 0.0–0.2)

## 2021-01-05 MED ORDER — LORAZEPAM 2 MG/ML IJ SOLN
1.0000 mg | INTRAMUSCULAR | Status: AC | PRN
  Administered 2021-01-08: 1 mg via INTRAVENOUS
  Filled 2021-01-05: qty 1

## 2021-01-05 MED ORDER — QUETIAPINE FUMARATE 50 MG PO TABS
50.0000 mg | ORAL_TABLET | Freq: Every day | ORAL | Status: DC
  Administered 2021-01-05 – 2021-01-16 (×12): 50 mg via ORAL
  Filled 2021-01-05 (×12): qty 1

## 2021-01-05 MED ORDER — LORAZEPAM 1 MG PO TABS
1.0000 mg | ORAL_TABLET | ORAL | Status: AC | PRN
Start: 2021-01-05 — End: 2021-01-08
  Filled 2021-01-05: qty 4

## 2021-01-05 MED ORDER — POTASSIUM CHLORIDE 20 MEQ PO PACK
20.0000 meq | PACK | Freq: Once | ORAL | Status: DC
  Filled 2021-01-05: qty 1

## 2021-01-05 NOTE — TOC Progression Note (Signed)
Transition of Care [facility]) - Progression Note    Patient Details  Name: Keith Andrews MRN: [id number] Date of Birth: Apr 13, 1955  [facility] of [facility]) CM/SW Contact  Astrid Drafts Berna Spare, RN Phone Number: 01/05/2021, 4:13 PM  Clinical Narrative:    Able to reach patient's daughter, Wandra Mannan, by phone: She states that prior to admission, patient lived with her aunt, Marigene Ehlers.  She states that patient has lengthy hx of ETOH use/abuse, and drinks more than 10 drinks per day.  She understands that patient's agitation and confusion is being caused by ETOH withdrawal.  She is hopeful that patient will be able to dc home with her aunt at some point.  Will continue to follow progress; will provide updates to family as patient progresses.       Barriers to Discharge: Continued Medical Work up  Expected Discharge Plan and Services     Discharge Planning Services: CM Consult   Living arrangements for the past 2 months: Single Family Home                                       Social Determinants of Health (SDOH) Interventions    Readmission Risk Interventions No flowsheet data found.  Quintella Baton, RN, BSN  Trauma/Neuro ICU Case Manager [phone]
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
## 2021-01-05 NOTE — Progress Notes (Signed)
Primary RN voiced concern to this TRN about pt not sleeping in the last several days and was wondering about an order for a medication to help the patient sleep/relax. Primary RN was informed that Dr. Janee Morn was the provider on and that he was in the hospital at this time (this was approximately 9:15pm). This TRN talked with Dr. Janee Morn as well (at approximately 10:45pm), who stated that he did not receive a page from the primary RN but that he will put in an order.

## 2021-01-06 LAB — CBC
HCT: 31.9 % — ABNORMAL LOW (ref 39.0–52.0)
Hemoglobin: 11 g/dL — ABNORMAL LOW (ref 13.0–17.0)
MCH: 31.2 pg (ref 26.0–34.0)
MCHC: 34.5 g/dL (ref 30.0–36.0)
MCV: 90.4 fL (ref 80.0–100.0)
Platelets: 284 10*3/uL (ref 150–400)
RBC: 3.53 MIL/uL — ABNORMAL LOW (ref 4.22–5.81)
RDW: 12.1 % (ref 11.5–15.5)
WBC: 9.1 10*3/uL (ref 4.0–10.5)
nRBC: 0 % (ref 0.0–0.2)

## 2021-01-06 LAB — BASIC METABOLIC PANEL
Anion gap: 12 (ref 5–15)
BUN: 6 mg/dL — ABNORMAL LOW (ref 8–23)
CO2: 21 mmol/L — ABNORMAL LOW (ref 22–32)
Calcium: 8.8 mg/dL — ABNORMAL LOW (ref 8.9–10.3)
Chloride: 100 mmol/L (ref 98–111)
Creatinine, Ser: 1.06 mg/dL (ref 0.61–1.24)
GFR, Estimated: >60 mL/min (ref >60)
Glucose, Bld: 119 mg/dL — ABNORMAL HIGH (ref 70–99)
Potassium: 3.3 mmol/L — ABNORMAL LOW (ref 3.5–5.1)
Sodium: 133 mmol/L — ABNORMAL LOW (ref 135–145)

## 2021-01-06 MED ORDER — ENSURE ENLIVE PO LIQD
237.0000 mL | Freq: Two times a day (BID) | ORAL | Status: DC
  Administered 2021-01-06 – 2021-01-17 (×21): 237 mL via ORAL

## 2021-01-06 MED ORDER — POTASSIUM CHLORIDE CRYS ER 20 MEQ PO TBCR
40.0000 meq | EXTENDED_RELEASE_TABLET | Freq: Once | ORAL | Status: AC
Start: 2021-01-06 — End: 2021-01-06
  Administered 2021-01-06: 40 meq via ORAL
  Filled 2021-01-06: qty 2
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
## 2021-01-06 NOTE — TOC Progression Note (Addendum)
Transition of Care [facility]) - Progression Note    Patient Details  Name: Keith Andrews MRN: [id number] Date of Birth: 08-Apr-1955  Transition of Care [facility]) CM/SW Contact  Ella Bodo, RN Phone Number: 01/06/2021, 2:30 PM  Clinical Narrative:    Met with patient today; his speech is unintelligible, though he is calm currently.  He remains in four-point restraints and on CIWA protocol.  PT/OT to re-eval for LOC needed at discharge.  Current recommendation is for skilled nursing facility placement, though will be a challenge due to no payor and current alcohol/drug use.  Will continue to follow patient progress.     Barriers to Discharge: Continued Medical Work up  Expected Discharge Plan and Services     Discharge Planning Services: CM Consult   Living arrangements for the past 2 months: Single Family Home                                       Social Determinants of Health (SDOH) Interventions    Readmission Risk Interventions No flowsheet data found.  Reinaldo Raddle, RN, BSN  Trauma/Neuro ICU Case Manager [phone]

## 2021-01-06 NOTE — Discharge Instructions (Addendum)
[facility]      [facility] °[phone] ° °OPEN ABDOMINAL SURGERY: POST OP INSTRUCTIONS ° °Always review your discharge instruction sheet given to you by the facility where your surgery was performed. ° °IF YOU HAVE DISABILITY OR FAMILY LEAVE FORMS, YOU MUST BRING THEM TO THE OFFICE FOR PROCESSING.  PLEASE DO NOT GIVE THEM TO YOUR DOCTOR. ° °A prescription for pain medication may be given to you upon discharge.  Take your pain medication as prescribed, if needed.  If narcotic pain medicine is not needed, then you may take acetaminophen (Tylenol) or ibuprofen (Advil) as needed. °Take your usually prescribed medications unless otherwise directed. °If you need a refill on your pain medication, please contact your pharmacy. They will contact our office to request authorization.  Prescriptions will not be filled after 5pm or on week-ends. °You should follow a light diet the first few days after arrival home, such as soup and crackers, pudding, etc.unless your doctor has advised otherwise. A high-fiber, low fat diet can be resumed as tolerated.   Be sure to include lots of fluids daily. Most patients will experience some swelling and bruising on the chest and neck area.  Ice packs will help.  Swelling and bruising can take several days to resolve °Most patients will experience some swelling and bruising in the area of the incision. Ice pack will help. Swelling and bruising can take several days to resolve..  °It is common to experience some constipation if taking pain medication after surgery.  Increasing fluid intake and taking a stool softener will usually help or prevent this problem from occurring.  A mild laxative (Milk of Magnesia or Miralax) should be taken according to package directions if there are no bowel movements after 48 hours. ° You may have steri-strips (small skin tapes) in place directly over the incision.  These strips should be left on the skin for 7-10 days.  If your surgeon used skin  glue on the incision, you may shower in 24 hours.  The glue will flake off over the next 2-3 weeks.  Any sutures or staples will be removed at the office during your follow-up visit. You may find that a light gauze bandage over your incision may keep your staples from being rubbed or pulled. You may shower and replace the bandage daily. °ACTIVITIES:  You may resume regular (light) daily activities beginning the next day--such as daily self-care, walking, climbing stairs--gradually increasing activities as tolerated.  You may have sexual intercourse when it is comfortable.  Refrain from any heavy lifting or straining until approved by your doctor. °You may drive when you no longer are taking prescription pain medication, you can comfortably wear a seatbelt, and you can safely maneuver your car and apply brakes ° °You should see your doctor in the office for a follow-up appointment approximately two weeks after your surgery.  Make sure that you call for this appointment within a day or two after you arrive home to insure a convenient appointment time. ° °WHEN TO CALL YOUR DOCTOR: °Fever over 101.0 °Inability to urinate °Nausea and/or vomiting °Extreme swelling or bruising °Continued bleeding from incision. °Increased pain, redness, or drainage from the incision. °Difficulty swallowing or breathing °Muscle cramping or spasms. °Numbness or tingling in hands or feet or around lips. ° °The clinic staff is available to answer your questions during regular business hours.  Please don’t hesitate to call and ask to speak to one of the nurses if you have concerns. ° °For   For further questions, please visit [email or web address]   MIDLINE WOUND CARE: - midline dressing to be changed daily - supplies: sterile saline, gauze scissors, ABD pads, tape  - if you have trouble finding sterile saline, saline eyedrops are sterile and ok to use; you can by gauze and tape at most pharmacies or [employer] - remove dressing  and all packing carefully, moistening with sterile saline as needed to avoid packing/internal dressing sticking to the wound. - clean edges of skin around the wound with water/gauze, making sure there is no tape debris or leakage left on skin that could cause skin irritation or breakdown. - dampen and clean gauze with sterile saline and pack wound from wound base to skin level, making sure to take note of any possible areas of wound tracking, tunneling and packing appropriately. Wound can be packed loosely.  - cover wound with a dry gauze and secure with tape.  - write the date/time on the dry dressing/tape to better track when the last dressing change occurred. - apply any skin protectant/powder recommended by clinician to protect skin/skin folds. - change dressing as needed if leakage occurs, wound gets contaminated, or patient requests to shower. - patient may shower daily with wound open and following the shower the wound should be dried and a clean dressing placed.
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
## 2021-01-07 LAB — BASIC METABOLIC PANEL
Anion gap: 10 (ref 5–15)
BUN: 8 mg/dL (ref 8–23)
CO2: 22 mmol/L (ref 22–32)
Calcium: 8.6 mg/dL — ABNORMAL LOW (ref 8.9–10.3)
Chloride: 104 mmol/L (ref 98–111)
Creatinine, Ser: 1.16 mg/dL (ref 0.61–1.24)
GFR, Estimated: >60 mL/min (ref >60)
Glucose, Bld: 119 mg/dL — ABNORMAL HIGH (ref 70–99)
Potassium: 4 mmol/L (ref 3.5–5.1)
Sodium: 136 mmol/L (ref 135–145)

## 2021-01-07 LAB — CBC
HCT: 31.7 % — ABNORMAL LOW (ref 39.0–52.0)
Hemoglobin: 10.5 g/dL — ABNORMAL LOW (ref 13.0–17.0)
MCH: 31.1 pg (ref 26.0–34.0)
MCHC: 33.1 g/dL (ref 30.0–36.0)
MCV: 93.8 fL (ref 80.0–100.0)
Platelets: 366 10*3/uL (ref 150–400)
RBC: 3.38 MIL/uL — ABNORMAL LOW (ref 4.22–5.81)
RDW: 12.6 % (ref 11.5–15.5)
WBC: 12 10*3/uL — ABNORMAL HIGH (ref 4.0–10.5)
nRBC: 0 % (ref 0.0–0.2)
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
## 2021-01-08 ENCOUNTER — Inpatient Hospital Stay (HOSPITAL_COMMUNITY): Payer: Self-pay

## 2021-01-08 LAB — CBC
HCT: 30 % — ABNORMAL LOW (ref 39.0–52.0)
Hemoglobin: 9.7 g/dL — ABNORMAL LOW (ref 13.0–17.0)
MCH: 30.5 pg (ref 26.0–34.0)
MCHC: 32.3 g/dL (ref 30.0–36.0)
MCV: 94.3 fL (ref 80.0–100.0)
Platelets: 452 10*3/uL — ABNORMAL HIGH (ref 150–400)
RBC: 3.18 MIL/uL — ABNORMAL LOW (ref 4.22–5.81)
RDW: 12.8 % (ref 11.5–15.5)
WBC: 12.4 10*3/uL — ABNORMAL HIGH (ref 4.0–10.5)
nRBC: 0 % (ref 0.0–0.2)

## 2021-01-08 MED ORDER — HALOPERIDOL 1 MG PO TABS
5.0000 mg | ORAL_TABLET | Freq: Two times a day (BID) | ORAL | Status: DC
  Administered 2021-01-08 – 2021-01-09 (×3): 5 mg via ORAL
  Filled 2021-01-08 (×3): qty 5

## 2021-01-08 MED ORDER — CLONIDINE HCL 0.1 MG PO TABS
0.1000 mg | ORAL_TABLET | Freq: Two times a day (BID) | ORAL | Status: DC
  Administered 2021-01-08 (×2): 0.1 mg via ORAL
  Filled 2021-01-08 (×2): qty 1

## 2021-01-08 NOTE — Plan of Care (Signed)
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
## 2021-01-08 NOTE — Progress Notes (Signed)
Patient being verbally aggressive with staff; CIWA protocol performed and 1 mg of ativan given; will reassess after patient calms down.

## 2021-01-08 NOTE — Progress Notes (Signed)
Abdominal dressing changes per orders, wound is pink with no odor, no drainage, pt tolerated well.

## 2021-01-09 ENCOUNTER — Inpatient Hospital Stay (HOSPITAL_COMMUNITY): Payer: Self-pay

## 2021-01-09 DIAGNOSIS — M79604 Pain in right leg: Secondary | ICD-10-CM

## 2021-01-09 LAB — CULTURE, RESPIRATORY W GRAM STAIN: Culture: NORMAL

## 2021-01-09 MED ORDER — HALOPERIDOL 5 MG PO TABS
5.0000 mg | ORAL_TABLET | Freq: Three times a day (TID) | ORAL | Status: DC
  Administered 2021-01-10 – 2021-01-17 (×22): 5 mg via ORAL
  Filled 2021-01-09 (×5): qty 1
  Filled 2021-01-09: qty 5
  Filled 2021-01-09 (×6): qty 1
  Filled 2021-01-09: qty 5
  Filled 2021-01-09 (×5): qty 1
  Filled 2021-01-09 (×3): qty 5
  Filled 2021-01-09 (×2): qty 1
  Filled 2021-01-09 (×2): qty 5
  Filled 2021-01-09 (×2): qty 1

## 2021-01-09 MED ORDER — CLONIDINE HCL 0.1 MG PO TABS
0.2000 mg | ORAL_TABLET | Freq: Two times a day (BID) | ORAL | Status: DC
  Administered 2021-01-09 – 2021-01-10 (×4): 0.2 mg via ORAL
  Filled 2021-01-09 (×5): qty 2

## 2021-01-09 MED ORDER — LORAZEPAM 2 MG/ML IJ SOLN
0.5000 mg | INTRAMUSCULAR | Status: DC | PRN
  Administered 2021-01-09: 0.5 mg via INTRAVENOUS
  Filled 2021-01-09: qty 1

## 2021-01-09 NOTE — Progress Notes (Signed)
Noted swelling and +2 edema and warmth in RLE and patient reported pain to touch. Paged PA on call. LLE looks WNL.
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
## 2021-01-09 NOTE — Progress Notes (Signed)
VASCULAR LAB    Right lower extremity venous duplex has been performed.  See CV proc for preliminary results.   Jebadiah Imperato, RVT 01/09/2021, 11:01 AM

## 2021-01-09 NOTE — Progress Notes (Addendum)
Received notification while on lunch that patient had unwitnessed fall and was found on floor with bed alarm ringing out. Charge RN and NT in room with patient when I entered and patient sitting on side of bed. Assessed patient thoroughly and found soft protuberance on right elbow. Patient states he fell on it and that it hurts. No other injuries noted and vitals WNL. Notified PA on call of fall and injury and awaiting new orders. Patient now calm and resting in bed with bed alarm on, fall mat in place, and bed in lowest position. Educated patient again on importance of using call bell to alert staff. Patient unable to explain why he was trying to get up. Ordered head start bed sensor as extra layer of protection and will continue to monitor patient.

## 2021-01-10 LAB — CBC
HCT: 29.8 % — ABNORMAL LOW (ref 39.0–52.0)
Hemoglobin: 9.8 g/dL — ABNORMAL LOW (ref 13.0–17.0)
MCH: 30.9 pg (ref 26.0–34.0)
MCHC: 32.9 g/dL (ref 30.0–36.0)
MCV: 94 fL (ref 80.0–100.0)
Platelets: 626 10*3/uL — ABNORMAL HIGH (ref 150–400)
RBC: 3.17 MIL/uL — ABNORMAL LOW (ref 4.22–5.81)
RDW: 12.9 % (ref 11.5–15.5)
WBC: 13 10*3/uL — ABNORMAL HIGH (ref 4.0–10.5)
nRBC: 0 % (ref 0.0–0.2)

## 2021-01-10 LAB — BASIC METABOLIC PANEL
Anion gap: 11 (ref 5–15)
BUN: 15 mg/dL (ref 8–23)
CO2: 19 mmol/L — ABNORMAL LOW (ref 22–32)
Calcium: 8.9 mg/dL (ref 8.9–10.3)
Chloride: 103 mmol/L (ref 98–111)
Creatinine, Ser: 1.12 mg/dL (ref 0.61–1.24)
GFR, Estimated: >60 mL/min (ref >60)
Glucose, Bld: 127 mg/dL — ABNORMAL HIGH (ref 70–99)
Potassium: 4.5 mmol/L (ref 3.5–5.1)
Sodium: 133 mmol/L — ABNORMAL LOW (ref 135–145)

## 2021-01-10 LAB — URIC ACID: Uric Acid, Serum: 8.2 mg/dL (ref 3.7–8.6)

## 2021-01-10 MED ORDER — PREDNISONE 10 MG (21) PO TBPK
10.0000 mg | ORAL_TABLET | Freq: Three times a day (TID) | ORAL | Status: AC
  Administered 2021-01-11 (×3): 10 mg via ORAL
  Filled 2021-01-10: qty 21

## 2021-01-10 MED ORDER — PREDNISONE 10 MG (21) PO TBPK
20.0000 mg | ORAL_TABLET | Freq: Every morning | ORAL | Status: AC
  Administered 2021-01-10: 20 mg via ORAL
  Filled 2021-01-10: qty 21

## 2021-01-10 MED ORDER — PREDNISONE 10 MG (21) PO TBPK
20.0000 mg | ORAL_TABLET | Freq: Every evening | ORAL | Status: AC
  Administered 2021-01-11: 20 mg via ORAL

## 2021-01-10 MED ORDER — PANTOPRAZOLE SODIUM 40 MG PO TBEC
40.0000 mg | DELAYED_RELEASE_TABLET | Freq: Every day | ORAL | Status: DC
  Administered 2021-01-10 – 2021-01-17 (×8): 40 mg via ORAL
  Filled 2021-01-10 (×8): qty 1

## 2021-01-10 MED ORDER — PREDNISONE 10 MG (21) PO TBPK
20.0000 mg | ORAL_TABLET | Freq: Every evening | ORAL | Status: AC
  Administered 2021-01-10: 20 mg via ORAL

## 2021-01-10 MED ORDER — PREDNISONE 10 MG (21) PO TBPK
10.0000 mg | ORAL_TABLET | ORAL | Status: AC
  Administered 2021-01-10: 10 mg via ORAL

## 2021-01-10 MED ORDER — MORPHINE SULFATE (PF) 2 MG/ML IV SOLN: 1.0000 mg | INTRAVENOUS | Status: DC | PRN

## 2021-01-10 MED ORDER — PREDNISONE 10 MG (21) PO TBPK
10.0000 mg | ORAL_TABLET | Freq: Four times a day (QID) | ORAL | Status: AC
  Administered 2021-01-12 – 2021-01-15 (×10): 10 mg via ORAL
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
## 2021-01-11 LAB — BASIC METABOLIC PANEL
Anion gap: 9 (ref 5–15)
BUN: 24 mg/dL — ABNORMAL HIGH (ref 8–23)
CO2: 21 mmol/L — ABNORMAL LOW (ref 22–32)
Calcium: 9 mg/dL (ref 8.9–10.3)
Chloride: 101 mmol/L (ref 98–111)
Creatinine, Ser: 1.1 mg/dL (ref 0.61–1.24)
GFR, Estimated: >60 mL/min (ref >60)
Glucose, Bld: 185 mg/dL — ABNORMAL HIGH (ref 70–99)
Potassium: 4.7 mmol/L (ref 3.5–5.1)
Sodium: 131 mmol/L — ABNORMAL LOW (ref 135–145)

## 2021-01-11 LAB — CBC
HCT: 30 % — ABNORMAL LOW (ref 39.0–52.0)
Hemoglobin: 9.9 g/dL — ABNORMAL LOW (ref 13.0–17.0)
MCH: 30.4 pg (ref 26.0–34.0)
MCHC: 33 g/dL (ref 30.0–36.0)
MCV: 92 fL (ref 80.0–100.0)
Platelets: 681 10*3/uL — ABNORMAL HIGH (ref 150–400)
RBC: 3.26 MIL/uL — ABNORMAL LOW (ref 4.22–5.81)
RDW: 12.6 % (ref 11.5–15.5)
WBC: 10.4 10*3/uL (ref 4.0–10.5)
nRBC: 0 % (ref 0.0–0.2)

## 2021-01-11 MED ORDER — CLONIDINE HCL 0.1 MG PO TABS
0.3000 mg | ORAL_TABLET | Freq: Two times a day (BID) | ORAL | Status: DC
  Administered 2021-01-11 – 2021-01-17 (×13): 0.3 mg via ORAL
  Filled 2021-01-11 (×12): qty 3

## 2021-01-11 MED ORDER — SODIUM CHLORIDE 0.9% FLUSH: 3.0000 mL | INTRAVENOUS | Status: DC | PRN

## 2021-01-11 MED ORDER — CHLORDIAZEPOXIDE HCL 5 MG PO CAPS
5.0000 mg | ORAL_CAPSULE | Freq: Three times a day (TID) | ORAL | Status: DC
  Administered 2021-01-11 – 2021-01-17 (×19): 5 mg via ORAL
  Filled 2021-01-11 (×20): qty 1

## 2021-01-11 MED ORDER — SODIUM CHLORIDE 0.9% FLUSH
3.0000 mL | Freq: Two times a day (BID) | INTRAVENOUS | Status: DC
  Administered 2021-01-11 – 2021-01-17 (×11): 3 mL via INTRAVENOUS

## 2021-01-11 MED ORDER — SPIRITUS FRUMENTI
1.0000 | Freq: Every day | ORAL | Status: DC
  Administered 2021-01-12: 1 via ORAL
  Filled 2021-01-11 (×6): qty 1

## 2021-01-11 MED ORDER — SODIUM CHLORIDE 0.9 % IV SOLN: 250.0000 mL | INTRAVENOUS | Status: DC | PRN
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
## 2021-01-14 NOTE — Plan of Care (Signed)
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
## 2021-01-16 MED ORDER — POLYETHYLENE GLYCOL 3350 17 G PO PACK
17.0000 g | PACK | Freq: Two times a day (BID) | ORAL | Status: DC
  Administered 2021-01-16 – 2021-01-17 (×2): 17 g via ORAL
  Filled 2021-01-16 (×2): qty 1
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
## 2021-01-17 ENCOUNTER — Other Ambulatory Visit (HOSPITAL_COMMUNITY): Payer: Self-pay

## 2021-01-17 MED ORDER — PANTOPRAZOLE SODIUM 40 MG PO TBEC
40.0000 mg | DELAYED_RELEASE_TABLET | Freq: Every day | ORAL | 0 refills | Status: AC
  Filled 2021-01-17: qty 30, 30d supply, fill #0

## 2021-01-17 MED ORDER — LABETALOL HCL 100 MG PO TABS
100.0000 mg | ORAL_TABLET | Freq: Two times a day (BID) | ORAL | 0 refills | Status: DC
  Filled 2021-01-17: qty 60, 30d supply, fill #0

## 2021-01-17 MED ORDER — DOCUSATE SODIUM 100 MG PO CAPS: 100.0000 mg | ORAL_CAPSULE | Freq: Every day | ORAL | Status: AC | PRN

## 2021-01-17 MED ORDER — POLYETHYLENE GLYCOL 3350 17 G PO PACK: 17.0000 g | PACK | Freq: Every day | ORAL | Status: DC | PRN

## 2021-01-17 MED ORDER — CLONIDINE HCL 0.3 MG PO TABS
0.3000 mg | ORAL_TABLET | Freq: Two times a day (BID) | ORAL | 0 refills | Status: DC
  Filled 2021-01-17: qty 60, 30d supply, fill #0

## 2021-01-17 MED ORDER — METHOCARBAMOL 500 MG PO TABS
1000.0000 mg | ORAL_TABLET | Freq: Three times a day (TID) | ORAL | 0 refills | Status: AC | PRN
  Filled 2021-01-17: qty 60, 10d supply, fill #0

## 2021-01-17 MED ORDER — ACETAMINOPHEN 500 MG PO TABS: 1000.0000 mg | ORAL_TABLET | Freq: Four times a day (QID) | ORAL | Status: AC | PRN

## 2021-01-17 MED ORDER — AMLODIPINE BESYLATE 10 MG PO TABS
10.0000 mg | ORAL_TABLET | Freq: Every day | ORAL | 0 refills | Status: DC
  Filled 2021-01-17: qty 30, 30d supply, fill #0

## 2021-01-17 MED ORDER — CHLORDIAZEPOXIDE HCL 5 MG PO CAPS
ORAL_CAPSULE | ORAL | 0 refills | Status: AC
Start: 2021-01-17 — End: 2021-01-23
  Filled 2021-01-17: qty 12, 6d supply, fill #0

## 2021-01-17 NOTE — TOC Transition Note (Signed)
Transition of Care [facility]) - CM/SW Discharge Note   Patient Details  Name: AGAMJOT KILGALLON MRN: [id number] Date of Birth: 08-26-1955  Transition of Care [facility]) CM/SW Contact:  Glennon Mac, RN Phone Number: 01/17/2021, 3:06 PM   Clinical Narrative:    Pt medically stable for discharge home today with family to assist with care.  PA has spoken with daughter, and she confirms that patient will have assistance and transport home.  Pt is uninsured, but is eligible for medication assistance through [facility] program. DC prescriptions sent to [facility] to be filled using MATCH letter. PT/OT has updated recommendations to OP therapy; will refer to [facility]hab for follow up.  RW and 3 in 1 recommended for home, but patient declines 3 in 1. Referral to [facility] for RW, to be delivered to bedside prior to dc.    Final next level of care: Home/Self Care Barriers to Discharge: Barriers Resolved   Patient Goals and CMS Choice Patient states their goals for this hospitalization and ongoing recovery are:: to go home                            Discharge Plan and Services   Discharge Planning Services: CM Consult            DME arranged: RW DME Agency: [facility] Date DME Agency Contacted: 01/17/21 Time DME Agency Contacted: 1344 Representative spoke with at DME Agency: Leavy Cella            Social Determinants of Health (SDOH) Interventions     Readmission Risk Interventions No flowsheet data found.  Quintella Baton, RN, BSN  Trauma/Neuro ICU Case Manager [phone]

## 2021-01-17 NOTE — Progress Notes (Signed)
Patient discharging home per MD order. Patients daughter and sister at bedside at time of discharge. Discharge instructions including medications, dressing change, signs and symptoms of wound infection, follow up appointments all reviewed with patient, patients daughter, and patients sister, with all questions being answered. This RN changed the patients abdominal dressing and explained the wet to dry dressing change steps to the patients daughter and sister. Both state understanding how to change the dressing. Patient in no acute distress at time of discharge.
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
## 2021-01-17 NOTE — Progress Notes (Signed)
Pt at supervision level for mobility at this time, PT now recommending d/c home with support of family and OPPT if pt has transportation. Given pt is uninsured, will likely not qualify for HHPT and is likely near pt's mobility baseline. Full note to follow.   Marye Round, PT DPT Acute Rehabilitation Services Pager [phone]  Office [phone]

## 2021-02-16 ENCOUNTER — Encounter: Payer: Self-pay | Admitting: Family Medicine

## 2021-02-16 ENCOUNTER — Other Ambulatory Visit: Payer: Self-pay

## 2021-02-16 ENCOUNTER — Ambulatory Visit: Payer: Self-pay | Attending: Family Medicine | Admitting: Family Medicine

## 2021-02-16 VITALS — BP 177/92 | HR 86 | Ht 68.0 in | Wt 172.0 lb

## 2021-02-16 DIAGNOSIS — M79672 Pain in left foot: Secondary | ICD-10-CM

## 2021-02-16 DIAGNOSIS — I10 Essential (primary) hypertension: Secondary | ICD-10-CM | POA: Insufficient documentation

## 2021-02-16 DIAGNOSIS — M79671 Pain in right foot: Secondary | ICD-10-CM

## 2021-02-16 DIAGNOSIS — K631 Perforation of intestine (nontraumatic): Secondary | ICD-10-CM

## 2021-02-16 DIAGNOSIS — D649 Anemia, unspecified: Secondary | ICD-10-CM

## 2021-02-16 DIAGNOSIS — R000 Tachycardia, unspecified: Secondary | ICD-10-CM

## 2021-02-16 MED ORDER — CLONIDINE HCL 0.3 MG PO TABS
0.3000 mg | ORAL_TABLET | Freq: Two times a day (BID) | ORAL | 1 refills | Status: DC
  Filled 2021-02-16: qty 60, 30d supply, fill #0

## 2021-02-16 MED ORDER — AMLODIPINE BESYLATE 10 MG PO TABS
10.0000 mg | ORAL_TABLET | Freq: Every day | ORAL | 6 refills | Status: DC
  Filled 2021-02-16: qty 30, 30d supply, fill #0

## 2021-02-16 MED ORDER — LABETALOL HCL 100 MG PO TABS
100.0000 mg | ORAL_TABLET | Freq: Two times a day (BID) | ORAL | 1 refills | Status: DC
  Filled 2021-02-16: qty 60, 30d supply, fill #0

## 2021-02-16 MED ORDER — NAPROXEN 500 MG PO TABS
500.0000 mg | ORAL_TABLET | Freq: Two times a day (BID) | ORAL | 1 refills | Status: DC
  Filled 2021-02-16: qty 60, 30d supply, fill #0

## 2021-02-16 NOTE — Patient Instructions (Signed)
Cooking With Less Salt Cooking with less salt is one way to reduce the amount of sodium you get from food. Sodium is one of the elements that make up salt. It is found naturally in foods and is also added to certain foods. Depending on your condition and overall health, your health care provider or dietitian may recommend that you reduce your sodium intake. Most people should have less than 2,300 milligrams (mg) of sodium each day. If you have high blood pressure (hypertension), you may need to limit your sodium to 1,500 mg each day. Follow the tipsbelow to help reduce your sodium intake. What are tips for eating less sodium? Reading food labels  Check the food label before buying or using packaged ingredients. Always check the label for the serving size and sodium content. Look for products with no more than 140 mg of sodium in one serving. Check the % Daily Value column to see what percent of the daily recommended amount of sodium is provided in one serving of the product. Foods with 5% or less in this column are considered low in sodium. Foods with 20% or higher are considered high in sodium. Do not choose foods with salt as one of the first three ingredients on the ingredients list. If salt is one of the first three ingredients, it usually means the item is high in sodium.  Shopping Buy sodium-free or low-sodium products. Look for the following words on food labels: Low-sodium. Sodium-free. Reduced-sodium. No salt added. Unsalted. Always check the sodium content even if foods are labeled as low-sodium or no salt added. Buy fresh foods. Cooking Use herbs, seasonings without salt, and spices as substitutes for salt. Use sodium-free baking soda when baking. Grill, braise, or roast foods to add flavor with less salt. Avoid adding salt to pasta, rice, or hot cereals. Drain and rinse canned vegetables, beans, and meat before use. Avoid adding salt when cooking sweets and desserts. Cook with  low-sodium ingredients. What foods are high in sodium? Vegetables Regular canned vegetables (not low-sodium or reduced-sodium). Sauerkraut, pickled vegetables, and relishes. Olives. French fries. Onion rings. Regular canned tomato sauce and paste. Regular tomato and vegetable juice. Frozenvegetables in sauces. Grains Instant hot cereals. Bread stuffing, pancake, and biscuit mixes. Croutons. Seasoned rice or pasta mixes. Noodle soup cups. Boxed or frozen macaroni and cheese. Regular salted crackers. Self-rising flour. Rolls. Bagels. Flourtortillas and wraps. Meats and other proteins Meat or fish that is salted, canned, smoked, cured, spiced, or pickled. This includes bacon, ham, sausages, hot dogs, corned beef, chipped beef, meat loaves, salt pork, jerky, pickled herring, anchovies, regular canned tuna, andsardines. Salted nuts. Dairy Processed cheese and cheese spreads. Cheese curds. Blue cheese. Feta cheese.String cheese. Regular cottage cheese. Buttermilk. Canned milk. The items listed above may not be a complete list of foods high in sodium. Actual amounts of sodium may be different depending on processing. Contact a dietitian for more information. What foods are low in sodium? Fruits Fresh, frozen, or canned fruit with no sauce added. Fruit juice. Vegetables Fresh or frozen vegetables with no sauce added. "No salt added" canned vegetables. "No salt added" tomato sauce and paste. Low-sodium orreduced-sodium tomato and vegetable juice. Grains Noodles, pasta, quinoa, rice. Shredded or puffed wheat or puffed rice. Regular or quick oats (not instant). Low-sodium crackers. Low-sodium bread. Whole-grainbread and whole-grain pasta. Unsalted popcorn. Meats and other proteins Fresh or frozen whole meats, poultry (not injected with sodium), and fish with no sauce added. Unsalted nuts. Dried peas, beans, and   lentils without added salt. Unsalted canned beans. Eggs. Unsalted nut butters. Low-sodium canned  tunaor chicken. Dairy Milk. Soy milk. Yogurt. Low-sodium cheeses, such as Swiss, Monterey Jack, mozzarella, and ricotta. Sherbet or ice cream (keep to  cup per serving).Cream cheese. Fats and oils Unsalted butter or margarine. Other foods Homemade pudding. Sodium-free baking soda and baking powder. Herbs and spices.Low-sodium seasoning mixes. Beverages Coffee and tea. Carbonated beverages. The items listed above may not be a complete list of foods low in sodium. Actual amounts of sodium may be different depending on processing. Contact a dietitian for more information. What are some salt alternatives when cooking? The following are herbs, seasonings, and spices that can be used instead of salt to flavor your food. Herbs should be fresh or dried. Do not choose packaged mixes. Next to the name of the herb, spice, or seasoning aresome examples of foods you can pair it with. Herbs Bay leaves - Soups, meat and vegetable dishes, and spaghetti sauce. Basil - Italian dishes, soups, pasta, and fish dishes. Cilantro - Meat, poultry, and vegetable dishes. Chili powder - Marinades and Mexican dishes. Chives - Salad dressings and potato dishes. Cumin - Mexican dishes, couscous, and meat dishes. Dill - Fish dishes, sauces, and salads. Fennel - Meat and vegetable dishes, breads, and cookies. Garlic (do not use garlic salt) - Italian dishes, meat dishes, salad dressings, and sauces. Marjoram - Soups, potato dishes, and meat dishes. Oregano - Pizza and spaghetti sauce. Parsley - Salads, soups, pasta, and meat dishes. Rosemary - Italian dishes, salad dressings, soups, and red meats. Saffron - Fish dishes, pasta, and some poultry dishes. Sage - Stuffings and sauces. Tarragon - Fish and poultry dishes. Thyme - Stuffing, meat, and fish dishes. Seasonings Lemon juice - Fish dishes, poultry dishes, vegetables, and salads. Vinegar - Salad dressings, vegetables, and fish dishes. Spices Cinnamon - Sweet  dishes, such as cakes, cookies, and puddings. Cloves - Gingerbread, puddings, and marinades for meats. Curry - Vegetable dishes, fish and poultry dishes, and stir-fry dishes. Ginger - Vegetable dishes, fish dishes, and stir-fry dishes. Nutmeg - Pasta, vegetables, poultry, fish dishes, and custard. Summary Cooking with less salt is one way to reduce the amount of sodium that you get from food. Buy sodium-free or low-sodium products. Check the food label before using or buying packaged ingredients. Use herbs, seasonings without salt, and spices as substitutes for salt in foods. This information is not intended to replace advice given to you by your health care provider. Make sure you discuss any questions you have with your healthcare provider. Document Revised: 01/28/2019 Document Reviewed: 01/28/2019 Elsevier Patient Education  2022 [employer]  
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
## 2021-02-17 ENCOUNTER — Telehealth: Payer: Self-pay

## 2021-02-17 LAB — CBC WITH DIFFERENTIAL/PLATELET
Basophils Absolute: 0 10*3/uL (ref 0.0–0.2)
Basos: 0 %
EOS (ABSOLUTE): 0.1 10*3/uL (ref 0.0–0.4)
Eos: 2 %
Hematocrit: 36.5 % — ABNORMAL LOW (ref 37.5–51.0)
Hemoglobin: 12.6 g/dL — ABNORMAL LOW (ref 13.0–17.7)
Immature Grans (Abs): 0 10*3/uL (ref 0.0–0.1)
Immature Granulocytes: 0 %
Lymphocytes Absolute: 3 10*3/uL (ref 0.7–3.1)
Lymphs: 48 %
MCH: 30.1 pg (ref 26.6–33.0)
MCHC: 34.5 g/dL (ref 31.5–35.7)
MCV: 87 fL (ref 79–97)
Monocytes Absolute: 0.5 10*3/uL (ref 0.1–0.9)
Monocytes: 9 %
Neutrophils Absolute: 2.6 10*3/uL (ref 1.4–7.0)
Neutrophils: 41 %
Platelets: 286 10*3/uL (ref 150–450)
RBC: 4.19 x10E6/uL (ref 4.14–5.80)
RDW: 12.4 % (ref 11.6–15.4)
WBC: 6.3 10*3/uL (ref 3.4–10.8)

## 2021-02-17 LAB — URIC ACID: Uric Acid: 8 mg/dL (ref 3.8–8.4)

## 2021-02-17 NOTE — Telephone Encounter (Signed)
Pt was called and no VM is set up to leave a message.  Letter mailed to patient.

## 2021-02-17 NOTE — Telephone Encounter (Signed)
-----   Message from Hoy Register, MD sent at 02/17/2021  9:40 AM EST ----- Labs reveal normal uric acid hence he does not have a gout flare.  He does have slight anemia which has improved significantly compared to 1 month ago.  Nothing additional needs to be done at this time.

## 2021-03-01 ENCOUNTER — Telehealth: Payer: Self-pay

## 2021-03-01 NOTE — Telephone Encounter (Signed)
Pt called and no VM set to leave message. Letter has already been mailed to patient.

## 2021-03-01 NOTE — Telephone Encounter (Signed)
-----   Message from Hoy Register, MD sent at 02/17/2021  9:40 AM EST ----- Labs reveal normal uric acid hence he does not have a gout flare.  He does have slight anemia which has improved significantly compared to 1 month ago.  Nothing additional needs to be done at this time.

## 2021-04-12 ENCOUNTER — Ambulatory Visit: Payer: Self-pay | Attending: Family Medicine | Admitting: Family Medicine

## 2021-05-30 ENCOUNTER — Telehealth: Payer: Self-pay | Admitting: Pharmacist

## 2021-05-30 NOTE — Patient Outreach (Signed)
Patient appearing on report for [employer] - Hypertension Control report due to last documented ambulatory blood pressure of 177/92 on 02/16/2021. Next appointment with PCP is not scheduled  ? ?Outreached patient to discuss hypertension control and medication management. Was unable to leave a voicemail on his listed line. Called his emergency contact, Olivia Mackie. Asked that she give him my number to call at his convenience.  ? ?Catie Hedwig Morton, PharmD, BCACP ?[place] ?[phone] ? ?

## 2021-06-09 NOTE — Telephone Encounter (Signed)
Attempted to outreach patient again. Left voicemail for him to return my call at his convenience.  ?

## 2021-10-04 ENCOUNTER — Telehealth: Payer: Self-pay | Admitting: Pharmacist

## 2021-10-04 NOTE — Telephone Encounter (Signed)
Patient attempted to be outreached by Jarome Matin, PharmD Candidate on 10/04/2021 to discuss hypertension. Patient's voicemail was unavailable at this time.   Catie Eppie Gibson, PharmD, [facility]  [phone]

## 2022-02-05 ENCOUNTER — Other Ambulatory Visit: Payer: Self-pay

## 2022-02-05 ENCOUNTER — Emergency Department (HOSPITAL_COMMUNITY)
Admission: EM | Admit: 2022-02-05 | Discharge: 2022-02-07 | Disposition: A | Discharge status: Home / Self Care | Payer: Self-pay | Attending: Student | Admitting: Student

## 2022-02-05 DIAGNOSIS — F1910 Other psychoactive substance abuse, uncomplicated: Secondary | ICD-10-CM

## 2022-02-05 DIAGNOSIS — Z008 Encounter for other general examination: Secondary | ICD-10-CM

## 2022-02-05 DIAGNOSIS — Z638 Other specified problems related to primary support group: Secondary | ICD-10-CM

## 2022-02-05 DIAGNOSIS — I10 Essential (primary) hypertension: Secondary | ICD-10-CM | POA: Insufficient documentation

## 2022-02-05 DIAGNOSIS — R451 Restlessness and agitation: Secondary | ICD-10-CM | POA: Insufficient documentation

## 2022-02-05 MED ORDER — THIAMINE HCL 100 MG/ML IJ SOLN: 100.0000 mg | Freq: Every day | INTRAMUSCULAR | Status: DC

## 2022-02-05 MED ORDER — LORAZEPAM 2 MG/ML IJ SOLN
0.0000 mg | Freq: Four times a day (QID) | INTRAMUSCULAR | Status: DC
  Filled 2022-02-05: qty 2

## 2022-02-05 MED ORDER — LORAZEPAM 1 MG PO TABS: 0.0000 mg | ORAL_TABLET | Freq: Two times a day (BID) | ORAL | Status: DC

## 2022-02-05 MED ORDER — LORAZEPAM 1 MG PO TABS: 0.0000 mg | ORAL_TABLET | Freq: Four times a day (QID) | ORAL | Status: DC

## 2022-02-05 MED ORDER — LORAZEPAM 2 MG/ML IJ SOLN: 0.0000 mg | Freq: Two times a day (BID) | INTRAMUSCULAR | Status: DC

## 2022-02-05 MED ORDER — THIAMINE MONONITRATE 100 MG PO TABS
100.0000 mg | ORAL_TABLET | Freq: Every day | ORAL | Status: DC
  Administered 2022-02-06: 100 mg via ORAL
  Filled 2022-02-05: qty 1
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
## 2022-02-06 DIAGNOSIS — R451 Restlessness and agitation: Secondary | ICD-10-CM

## 2022-02-06 LAB — COMPREHENSIVE METABOLIC PANEL
ALT: 25 U/L (ref 0–44)
AST: 34 U/L (ref 15–41)
Albumin: 3.5 g/dL (ref 3.5–5.0)
Alkaline Phosphatase: 85 U/L (ref 38–126)
Anion gap: 10 (ref 5–15)
BUN: 15 mg/dL (ref 8–23)
CO2: 18 mmol/L — ABNORMAL LOW (ref 22–32)
Calcium: 9.4 mg/dL (ref 8.9–10.3)
Chloride: 106 mmol/L (ref 98–111)
Creatinine, Ser: 0.89 mg/dL (ref 0.61–1.24)
GFR, Estimated: >60 mL/min (ref >60)
Glucose, Bld: 105 mg/dL — ABNORMAL HIGH (ref 70–99)
Potassium: 4 mmol/L (ref 3.5–5.1)
Sodium: 134 mmol/L — ABNORMAL LOW (ref 135–145)
Total Bilirubin: 0.4 mg/dL (ref 0.3–1.2)
Total Protein: 8.9 g/dL — ABNORMAL HIGH (ref 6.5–8.1)

## 2022-02-06 LAB — CBC WITH DIFFERENTIAL/PLATELET
Abs Immature Granulocytes: 0.01 10*3/uL (ref 0.00–0.07)
Basophils Absolute: 0 10*3/uL (ref 0.0–0.1)
Basophils Relative: 1 %
Eosinophils Absolute: 0.1 10*3/uL (ref 0.0–0.5)
Eosinophils Relative: 2 %
HCT: 43.4 % (ref 39.0–52.0)
Hemoglobin: 14.1 g/dL (ref 13.0–17.0)
Immature Granulocytes: 0 %
Lymphocytes Relative: 53 %
Lymphs Abs: 2.7 10*3/uL (ref 0.7–4.0)
MCH: 28.5 pg (ref 26.0–34.0)
MCHC: 32.5 g/dL (ref 30.0–36.0)
MCV: 87.7 fL (ref 80.0–100.0)
Monocytes Absolute: 0.4 10*3/uL (ref 0.1–1.0)
Monocytes Relative: 7 %
Neutro Abs: 1.9 10*3/uL (ref 1.7–7.7)
Neutrophils Relative %: 37 %
Platelets: 317 10*3/uL (ref 150–400)
RBC: 4.95 MIL/uL (ref 4.22–5.81)
RDW: 14.7 % (ref 11.5–15.5)
WBC: 5.1 10*3/uL (ref 4.0–10.5)
nRBC: 0 % (ref 0.0–0.2)

## 2022-02-06 LAB — RAPID URINE DRUG SCREEN, HOSP PERFORMED
Amphetamines: NOT DETECTED
Barbiturates: NOT DETECTED
Benzodiazepines: NOT DETECTED
Cocaine: NOT DETECTED
Opiates: NOT DETECTED
Tetrahydrocannabinol: NOT DETECTED

## 2022-02-06 LAB — ETHANOL: Alcohol, Ethyl (B): 142 mg/dL — ABNORMAL HIGH (ref <10)

## 2022-02-06 MED ORDER — TRAZODONE HCL 100 MG PO TABS: 50.0000 mg | ORAL_TABLET | Freq: Every evening | ORAL | Status: DC | PRN

## 2022-02-06 MED ORDER — DOCUSATE SODIUM 100 MG PO CAPS: 100.0000 mg | ORAL_CAPSULE | Freq: Every day | ORAL | Status: DC | PRN

## 2022-02-06 MED ORDER — ACETAMINOPHEN 500 MG PO TABS
1000.0000 mg | ORAL_TABLET | Freq: Four times a day (QID) | ORAL | Status: DC | PRN
  Administered 2022-02-07 (×2): 1000 mg via ORAL
  Filled 2022-02-06 (×2): qty 2
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
## 2022-02-06 NOTE — ED Provider Notes (Signed)
Emergency Medicine Observation Re-evaluation Note  Keith Andrews is a 66 y.o. male, seen on rounds today.  Pt initially presented to the ED for complaints of No chief complaint on file. Currently, the patient is resting, under IVC.  Physical Exam  BP (!) 170/102 (BP Location: Left Arm)   Pulse (!) 101   Temp 97.8 F (36.6 C) (Oral)   Resp 18   SpO2 97%  Physical Exam General: NAD Cardiac: well perfused Lungs: even and unlabored Psych: no agitation  ED Course / MDM  EKG:   I have reviewed the labs performed to date as well as medications administered while in observation.  Recent changes in the last 24 hours include pt under IVC, presented to ER yesterday. Medically cleared. TTS consult pending.  Plan  Current plan is for TTS for dispo.    Keith Avena, MD 02/06/22 [phone]

## 2022-02-07 NOTE — ED Notes (Signed)
Pt sleeping at present time,
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
## 2022-04-21 ENCOUNTER — Encounter (HOSPITAL_COMMUNITY): Payer: Self-pay

## 2022-04-21 ENCOUNTER — Emergency Department (HOSPITAL_COMMUNITY): Payer: Self-pay

## 2022-04-21 ENCOUNTER — Other Ambulatory Visit: Payer: Self-pay

## 2022-04-21 ENCOUNTER — Emergency Department (HOSPITAL_COMMUNITY)
Admission: EM | Admit: 2022-04-21 | Discharge: 2022-04-21 | Disposition: A | Discharge status: Home / Self Care | Payer: Self-pay | Attending: Emergency Medicine | Admitting: Emergency Medicine

## 2022-04-21 DIAGNOSIS — Z5900 Homelessness unspecified: Secondary | ICD-10-CM | POA: Insufficient documentation

## 2022-04-21 DIAGNOSIS — I10 Essential (primary) hypertension: Secondary | ICD-10-CM | POA: Insufficient documentation

## 2022-04-21 DIAGNOSIS — M545 Low back pain, unspecified: Secondary | ICD-10-CM | POA: Insufficient documentation

## 2022-04-21 DIAGNOSIS — Z79899 Other long term (current) drug therapy: Secondary | ICD-10-CM | POA: Insufficient documentation

## 2022-04-21 MED ORDER — CYCLOBENZAPRINE HCL 10 MG PO TABS
10.0000 mg | ORAL_TABLET | Freq: Once | ORAL | Status: AC
  Administered 2022-04-21: 10 mg via ORAL
  Filled 2022-04-21: qty 1

## 2022-04-21 MED ORDER — ACETAMINOPHEN 325 MG PO TABS
650.0000 mg | ORAL_TABLET | Freq: Once | ORAL | Status: AC
  Administered 2022-04-21: 650 mg via ORAL
  Filled 2022-04-21: qty 2

## 2022-04-21 MED ORDER — METHYLPREDNISOLONE 4 MG PO TBPK
ORAL_TABLET | ORAL | 0 refills | Status: DC
  Filled 2022-04-21: qty 21, fill #0

## 2022-04-21 MED ORDER — KETOROLAC TROMETHAMINE 30 MG/ML IJ SOLN
30.0000 mg | Freq: Once | INTRAMUSCULAR | Status: AC
  Administered 2022-04-21: 30 mg via INTRAMUSCULAR
  Filled 2022-04-21: qty 1

## 2022-04-21 NOTE — ED Provider Notes (Signed)
[name]   CSN: [id number] Arrival date & time: 04/21/22  1903     History  Chief Complaint  Patient presents with   Back Pain    Keith Andrews is a 67 y.o. male.  Patient here with left lower back pain for the last few days.  History of the same.  History of hypertension.  He has not had his medications as he is currently in between homes.  He is homeless.  Denies any specific trauma.  Nothing makes it worse or better.  Denies any loss of bowel or bladder.  Denies any trauma.  Denies any weakness numbness or tingling.  Pain mostly in his left lower back.  Feels like he has tightness in his back.  The history is provided by the patient.       Home Medications Prior to Admission medications   Medication Sig Start Date End Date Taking? Authorizing Provider  acetaminophen (TYLENOL) 500 MG tablet Take 2 tablets (1,000 mg total) by mouth every 6 (six) hours as needed for mild pain or fever. 01/17/21   Norm Parcel, PA-C  amLODipine (NORVASC) 10 MG tablet Take 1 tablet (10 mg total) by mouth daily. 02/16/21 03/18/21  Charlott Rakes, MD  cloNIDine (CATAPRES) 0.3 MG tablet Take 1 tablet (0.3 mg total) by mouth 2 (two) times daily. 02/16/21 03/18/21  Charlott Rakes, MD  docusate sodium (COLACE) 100 MG capsule Take 1 capsule (100 mg total) by mouth daily as needed for mild constipation. Patient not taking: Reported on 02/07/2022 01/17/21   Norm Parcel, PA-C  labetalol (NORMODYNE) 100 MG tablet Take 1 tablet (100 mg total) by mouth 2 (two) times daily. 02/16/21 03/18/21  Charlott Rakes, MD  methocarbamol (ROBAXIN) 500 MG tablet Take 2 tablets (1,000 mg total) by mouth every 8 (eight) hours as needed for muscle spasms. Patient not taking: Reported on 02/07/2022 01/17/21   Norm Parcel, PA-C  naproxen (NAPROSYN) 500 MG tablet Take 1 tablet (500 mg total) by mouth 2 (two) times daily with a meal. Patient not taking:  Reported on 02/07/2022 02/16/21   Charlott Rakes, MD  pantoprazole (PROTONIX) 40 MG tablet Take 1 tablet (40 mg total) by mouth daily. 01/18/21 02/17/21  Norm Parcel, PA-C      Allergies    Patient has no known allergies.    Review of Systems   Review of Systems  Physical Exam Updated Vital Signs BP (!) 151/83 (BP Location: Left Arm)   Pulse (!) 115   Temp 98.3 F (36.8 C) (Oral)   Resp (!) 21   SpO2 99%  Physical Exam Vitals and nursing note reviewed.  Constitutional:      General: He is not in acute distress.    Appearance: He is well-developed.  HENT:     Head: Normocephalic and atraumatic.     Nose: Nose normal.     Mouth/Throat:     Mouth: Mucous membranes are moist.  Eyes:     Extraocular Movements: Extraocular movements intact.     Conjunctiva/sclera: Conjunctivae normal.     Pupils: Pupils are equal, round, and reactive to light.  Cardiovascular:     Rate and Rhythm: Normal rate and regular rhythm.     Pulses: Normal pulses.     Heart sounds: Normal heart sounds. No murmur heard. Pulmonary:     Effort: Pulmonary effort is normal. No respiratory distress.     Breath sounds: Normal breath sounds.  Abdominal:     Palpations: Abdomen is soft.     Tenderness: There is no abdominal tenderness.  Musculoskeletal:        General: Tenderness present. No swelling.     Cervical back: Neck supple.     Comments: Tenderness to the paraspinal lumbar muscles on the left  Skin:    General: Skin is warm and dry.     Capillary Refill: Capillary refill takes less than 2 seconds.  Neurological:     General: No focal deficit present.     Mental Status: He is alert and oriented to person, place, and time.     Cranial Nerves: No cranial nerve deficit.     Sensory: No sensory deficit.     Motor: No weakness.     Coordination: Coordination normal.     Comments: 5+ out of 5 strength throughout, normal sensation  Psychiatric:        Mood and Affect: Mood normal.     ED  Results / Procedures / Treatments   Labs (all labs ordered are listed, but only abnormal results are displayed) Labs Reviewed - No data to display  EKG None  Radiology DG Lumbar Spine Complete  Result Date: 04/21/2022 CLINICAL DATA:  Lower back/upper buttock pain EXAM: LUMBAR SPINE - COMPLETE 4+ VIEW COMPARISON:  CT examination dated December 31, 2020 FINDINGS: There is no evidence of lumbar spine fracture. Alignment is normal. Multilevel degenerate disc disease most prominent at L4-L5 and L5-S1 with associated facet joint arthropathy. IMPRESSION: Multilevel degenerate disc disease most prominent at L4-L5 and L5-S1 with associated facet joint arthropathy. Electronically Signed   By: Keane Police D.O.   On: 04/21/2022 19:53    Procedures Procedures    Medications Ordered in ED Medications  acetaminophen (TYLENOL) tablet 650 mg (650 mg Oral Given 04/21/22 1922)  cyclobenzaprine (FLEXERIL) tablet 10 mg (10 mg Oral Given 04/21/22 1923)  ketorolac (TORADOL) 30 MG/ML injection 30 mg (30 mg Intramuscular Given 04/21/22 2002)    ED Course/ Medical Decision Making/ A&P                             Medical Decision Making Amount and/or Complexity of Data Reviewed Radiology: ordered.  Risk OTC drugs. Prescription drug management.   Keith Andrews is here for back pain.  History of hypertension.  He has reproducible tenderness in his paraspinal lumbar muscles on the left with increased tone.  No midline spinal pain.  Strength and sensation appear to be intact in his lower extremities.  He has normal pulses in his lower extremities.  He has no loss of bowel or bladder.  Denies trauma.  Differential diagnosis likely muscle spasm may be sciatica.  Will get an x-ray and give Tylenol, Flexeril and Toradol.  Denies any abdominal pain nausea vomiting or diarrhea.  Denies history of kidney stones.  No pain urination.  No hematuria.  X-rays unremarkable per my review and interpretation.  Overall suspect  acute on chronic pain.  Discharged in good condition.  This chart was dictated using voice recognition software.  Despite best efforts to proofread,  errors can occur which can change the documentation meaning.         Final Clinical Impression(s) / ED Diagnoses Final diagnoses:  Acute low back pain, unspecified back pain laterality, unspecified whether sciatica present    Rx / DC Orders ED Discharge Orders          Ordered  methylPREDNISolone (MEDROL DOSEPAK) 4 MG TBPK tablet  Status:  Discontinued        04/21/22 2219              Lennice Sites, DO 04/21/22 2223

## 2022-04-21 NOTE — ED Triage Notes (Signed)
Pt arrived via [facility] w c/o back pain 10/10. Chronic, but has got worse over the last 24 hours.

## 2022-04-23 ENCOUNTER — Other Ambulatory Visit (HOSPITAL_COMMUNITY): Payer: Self-pay

## 2023-04-29 ENCOUNTER — Emergency Department (HOSPITAL_COMMUNITY): Payer: Self-pay

## 2023-04-29 ENCOUNTER — Emergency Department (HOSPITAL_COMMUNITY)
Admission: EM | Admit: 2023-04-29 | Discharge: 2023-04-29 | Disposition: A | Discharge status: Home / Self Care | Payer: Self-pay

## 2023-04-29 ENCOUNTER — Encounter (HOSPITAL_COMMUNITY): Payer: Self-pay | Admitting: Emergency Medicine

## 2023-04-29 ENCOUNTER — Other Ambulatory Visit: Payer: Self-pay

## 2023-04-29 DIAGNOSIS — S0181XA Laceration without foreign body of other part of head, initial encounter: Secondary | ICD-10-CM | POA: Insufficient documentation

## 2023-04-29 DIAGNOSIS — Z23 Encounter for immunization: Secondary | ICD-10-CM | POA: Insufficient documentation

## 2023-04-29 DIAGNOSIS — S0990XA Unspecified injury of head, initial encounter: Secondary | ICD-10-CM

## 2023-04-29 DIAGNOSIS — I10 Essential (primary) hypertension: Secondary | ICD-10-CM | POA: Insufficient documentation

## 2023-04-29 DIAGNOSIS — Z79899 Other long term (current) drug therapy: Secondary | ICD-10-CM | POA: Insufficient documentation

## 2023-04-29 MED ORDER — LIDOCAINE-EPINEPHRINE (PF) 2 %-1:200000 IJ SOLN
10.0000 mL | Freq: Once | INTRAMUSCULAR | Status: AC
  Administered 2023-04-29: 10 mL
  Filled 2023-04-29: qty 20

## 2023-04-29 MED ORDER — ACETAMINOPHEN 325 MG PO TABS
650.0000 mg | ORAL_TABLET | Freq: Once | ORAL | Status: DC
  Filled 2023-04-29: qty 2

## 2023-04-29 MED ORDER — TETANUS-DIPHTH-ACELL PERTUSSIS 5-2.5-18.5 LF-MCG/0.5 IM SUSY
0.5000 mL | PREFILLED_SYRINGE | Freq: Once | INTRAMUSCULAR | Status: AC
  Administered 2023-04-29: 0.5 mL via INTRAMUSCULAR
  Filled 2023-04-29: qty 0.5

## 2023-04-29 MED ORDER — KETOROLAC TROMETHAMINE 15 MG/ML IJ SOLN
15.0000 mg | Freq: Once | INTRAMUSCULAR | Status: AC
  Administered 2023-04-29: 15 mg via INTRAMUSCULAR
  Filled 2023-04-29: qty 1

## 2023-04-29 NOTE — ED Notes (Signed)
 Pt was able to ambulate to restroom without any assist.
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
## 2023-04-29 NOTE — Discharge Instructions (Addendum)
 Please follow-up with your doctor or return to the ER/urgent care to have your sutures removed in 5 to 7 days.  Return if you notice any fevers, drainage, or redness from the wound.
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
## 2023-06-03 IMAGING — DX DG ELBOW COMPLETE 3+V*R*
4 series · 4 of 4 positions shown · non-contrast
Comparison: None.

CLINICAL DATA: Fell

EXAM:
RIGHT ELBOW - COMPLETE 3+ VIEW

[elbow ap]
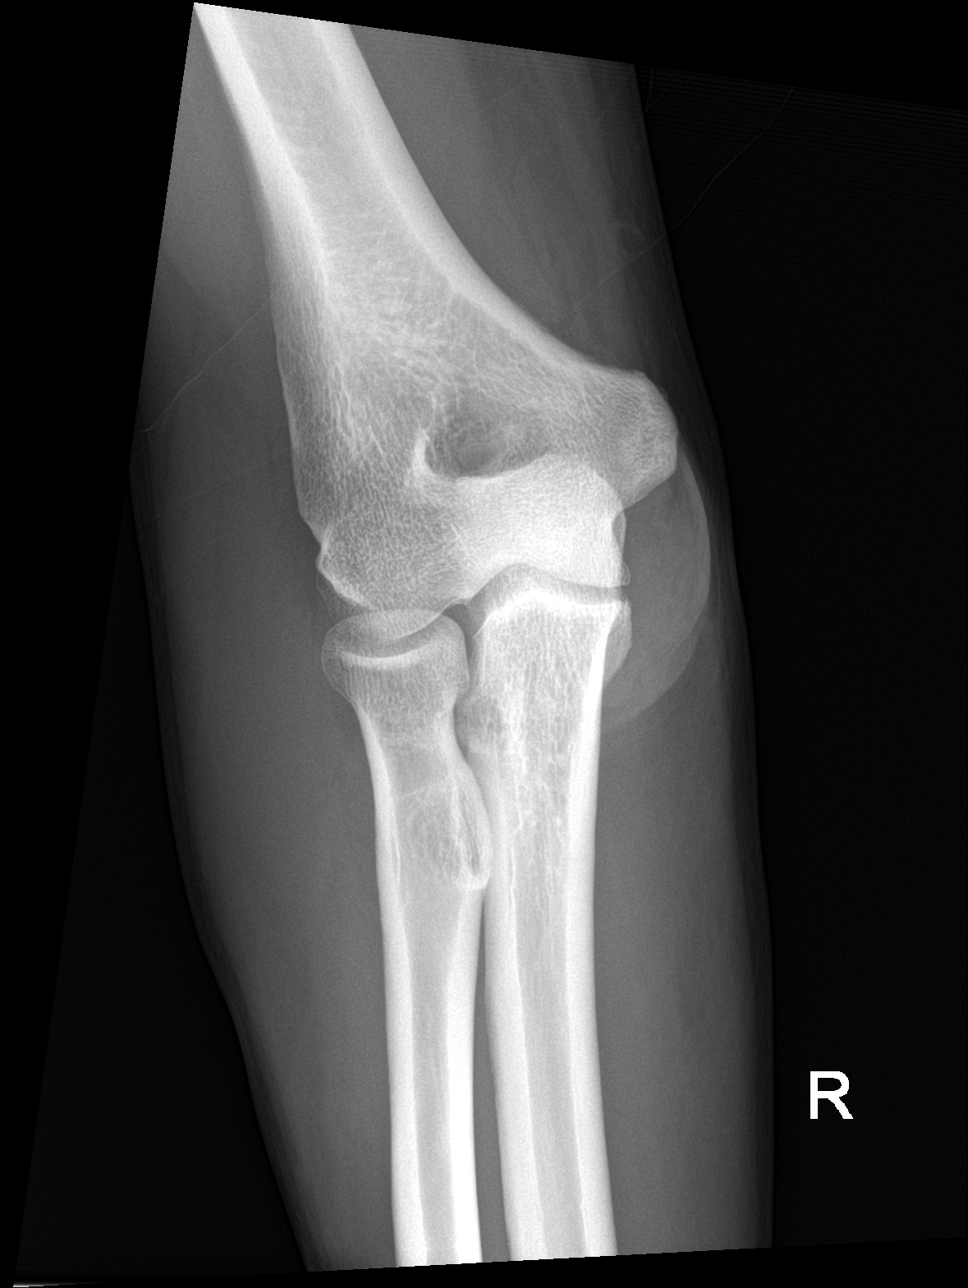

[elbow obl (1 of 2)]
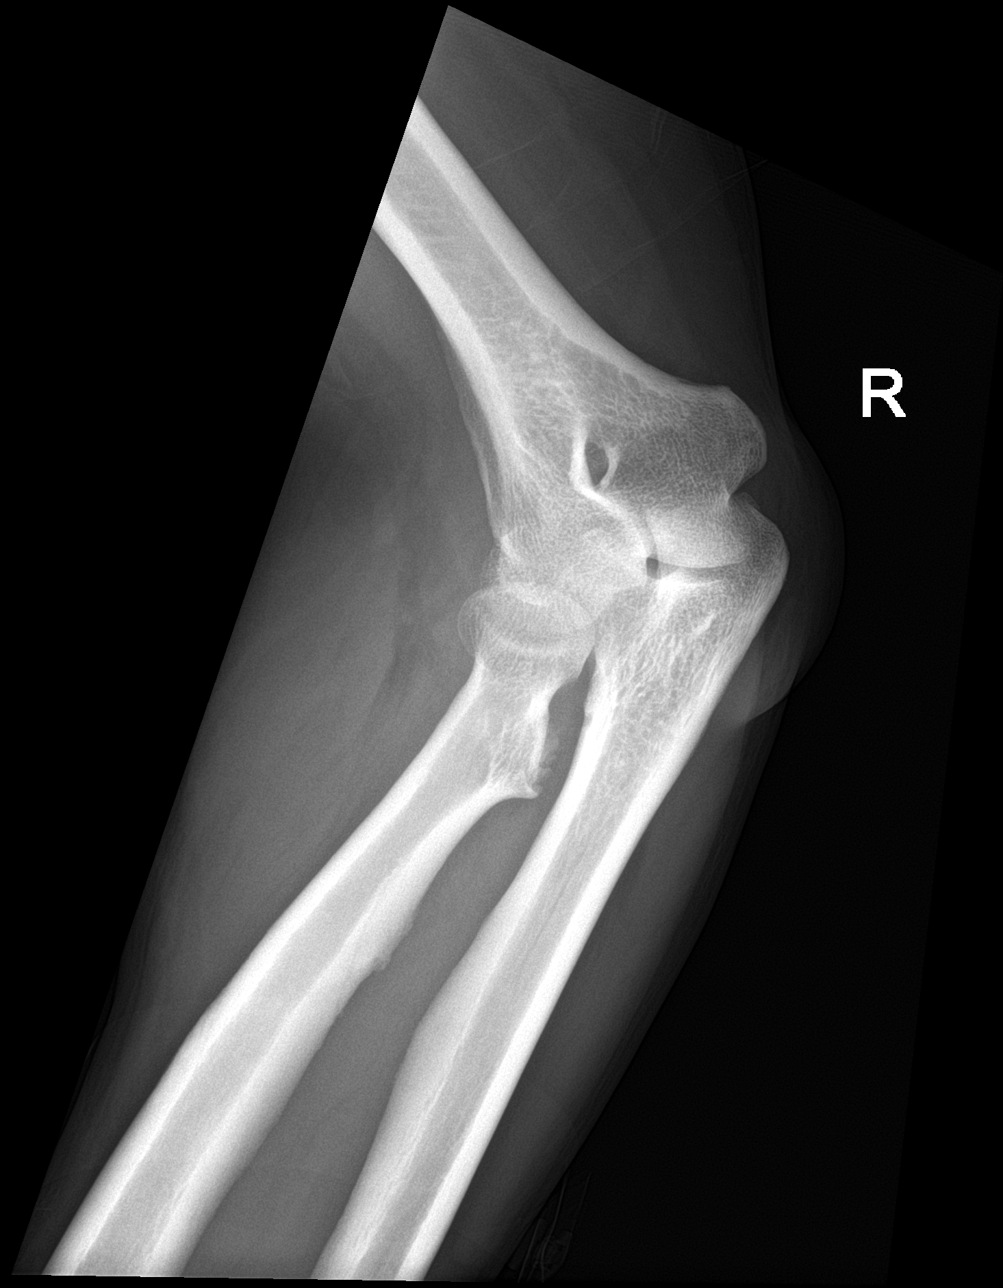

[elbow obl (2 of 2)]
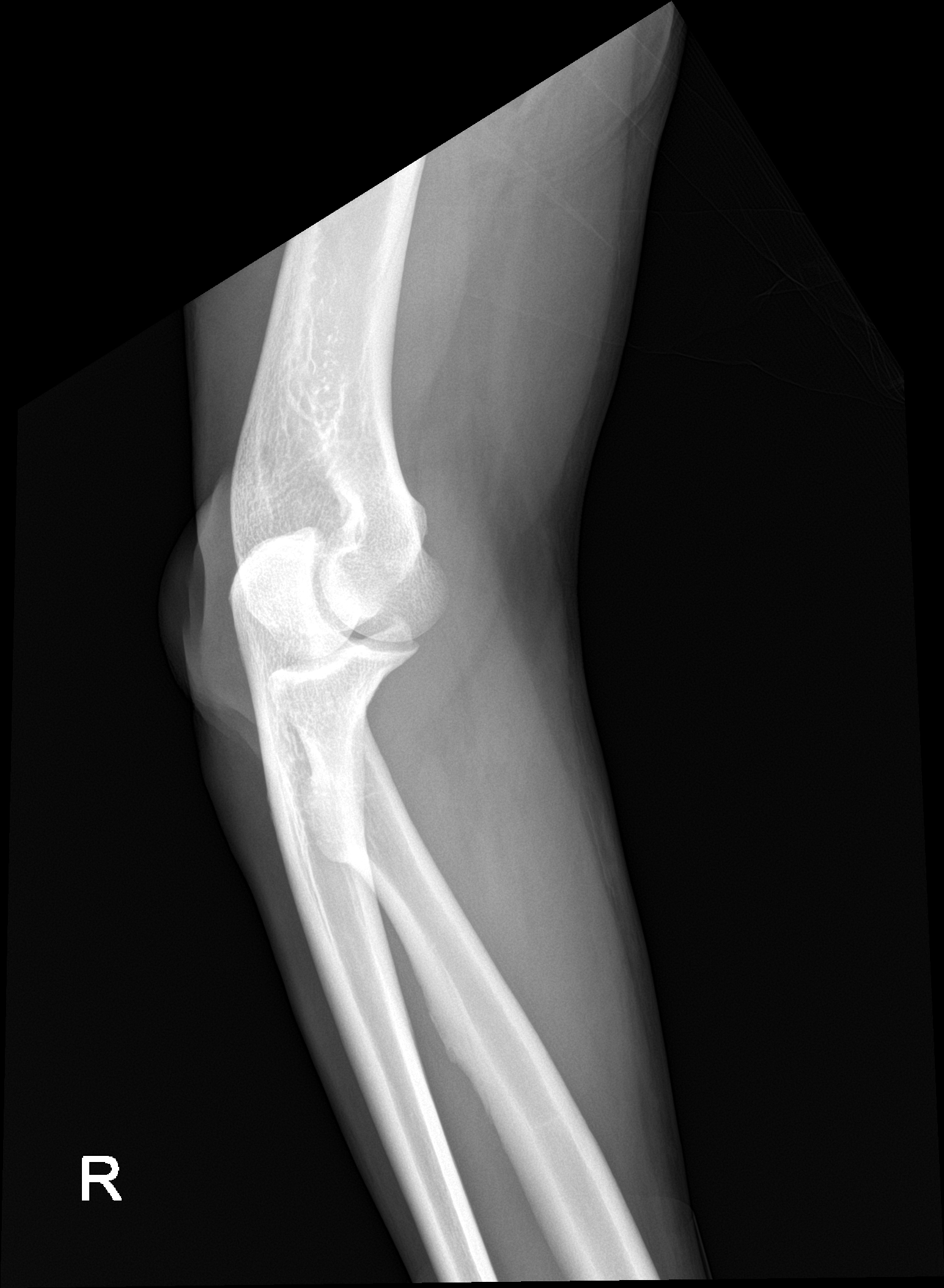

[elbow lat]
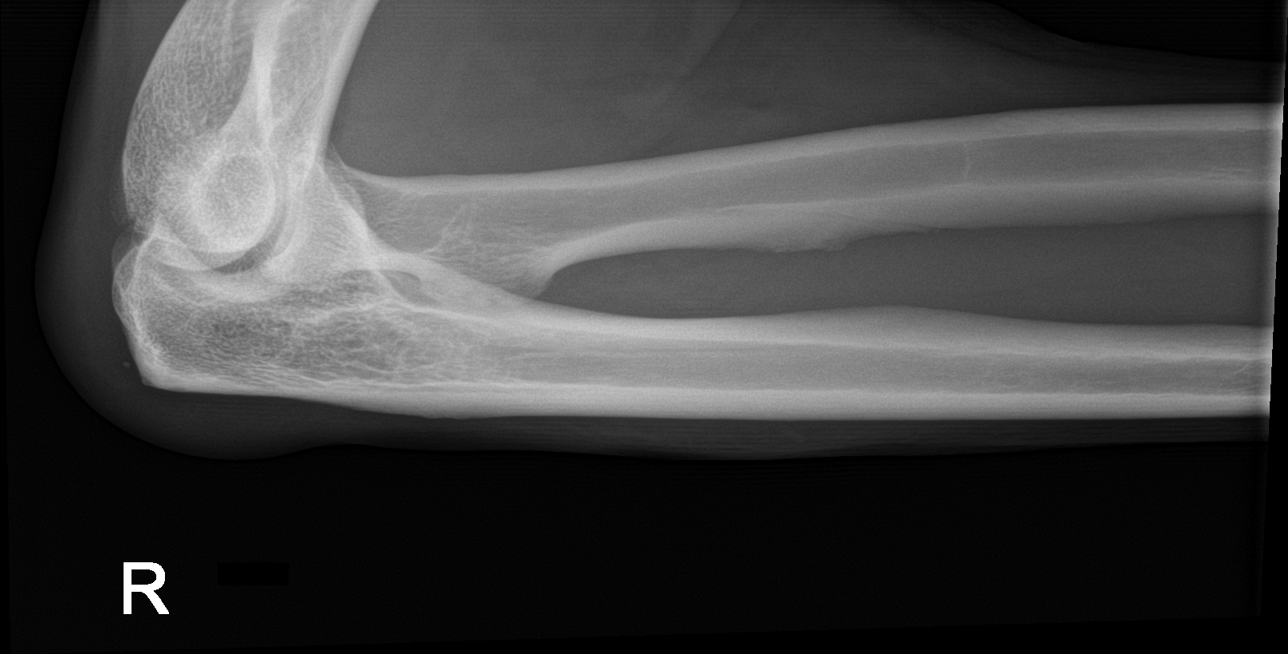

[4 of 4 positions shown; findings below may reference images not displayed]

FINDINGS: Frontal, bilateral oblique, lateral views of the right elbow are
obtained. Evaluation is somewhat limited due to suboptimal
positioning. No acute displaced fracture, subluxation, or
dislocation. Joint spaces are well preserved. No evidence of joint
effusion. Dorsal soft tissue swelling overlying the olecranon could
be related to recent trauma or olecranon bursitis.
IMPRESSION: 1. No acute displaced fracture.
2. Dorsal soft tissue swelling overlying the olecranon, consistent
with sequela of recent trauma versus bursitis.

## 2023-10-25 ENCOUNTER — Ambulatory Visit: Payer: Self-pay

## 2023-10-25 NOTE — Telephone Encounter (Signed)
  FYI Only or Action Required?: FYI only for provider.  Patient was last seen in primary care on 02/16/2021 by Newlin, Enobong, MD.  Called Nurse Triage reporting Arm Swelling. Also eye issue and Knots on back  Symptoms began All of these issues have been ongoing for a long time.  Pt has been seen by other providers for these issues. .  Interventions attempted: Other: see at ED and other providers  - MRI has been done.  Symptoms are: stable.  Triage Disposition: See PCP When Office is Open (Within 3 Days)  Patient/caregiver understands and will follow disposition?: Yes                 Copied from CRM [id number]. 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 Refer to attachments for appointment information. Reason for Disposition  [1] Small area of localized swelling AND [2] not better after 3 days  Answer Assessment - Initial Assessment Questions 1. ONSET: When did the swelling start? (e.g., minutes, hours, days)     Ongoing a long times 2. LOCATION: What part of the arm is swollen?  Are both arms swollen or just one arm?     Right arm 7. CAUSE: What do you think is causing the arm swelling?     Unsure - has been seen for this  10. OTHER SYMPTOMS: Do you have any other symptoms? (e.g., chest pain, difficulty breathing)       Eye issue, knot on back,  homeless  Protocols used: Arm Swelling and Edema-A-AH

## 2023-10-25 NOTE — Telephone Encounter (Signed)
Noted patient has upcoming appointment. 

## 2023-10-28 ENCOUNTER — Ambulatory Visit (HOSPITAL_BASED_OUTPATIENT_CLINIC_OR_DEPARTMENT_OTHER): Payer: Self-pay | Admitting: Family Medicine

## 2023-10-28 ENCOUNTER — Emergency Department (HOSPITAL_COMMUNITY)
Admission: EM | Admit: 2023-10-28 | Discharge: 2023-10-28 | Disposition: A | Discharge status: Home / Self Care | Attending: Emergency Medicine | Admitting: Emergency Medicine

## 2023-10-28 ENCOUNTER — Emergency Department (HOSPITAL_COMMUNITY)

## 2023-10-28 ENCOUNTER — Telehealth: Payer: Self-pay

## 2023-10-28 ENCOUNTER — Other Ambulatory Visit: Payer: Self-pay

## 2023-10-28 ENCOUNTER — Encounter: Payer: Self-pay | Admitting: Family Medicine

## 2023-10-28 VITALS — BP 182/75 | HR 64 | Ht 68.0 in | Wt 156.0 lb

## 2023-10-28 DIAGNOSIS — M1A9XX1 Chronic gout, unspecified, with tophus (tophi): Secondary | ICD-10-CM | POA: Diagnosis not present

## 2023-10-28 DIAGNOSIS — Z7182 Exercise counseling: Secondary | ICD-10-CM | POA: Insufficient documentation

## 2023-10-28 DIAGNOSIS — M1A00X1 Idiopathic chronic gout, unspecified site, with tophus (tophi): Secondary | ICD-10-CM

## 2023-10-28 DIAGNOSIS — Z139 Encounter for screening, unspecified: Secondary | ICD-10-CM | POA: Diagnosis not present

## 2023-10-28 DIAGNOSIS — Z713 Dietary counseling and surveillance: Secondary | ICD-10-CM | POA: Insufficient documentation

## 2023-10-28 DIAGNOSIS — S29019A Strain of muscle and tendon of unspecified wall of thorax, initial encounter: Secondary | ICD-10-CM

## 2023-10-28 DIAGNOSIS — I10 Essential (primary) hypertension: Secondary | ICD-10-CM | POA: Insufficient documentation

## 2023-10-28 DIAGNOSIS — S29012A Strain of muscle and tendon of back wall of thorax, initial encounter: Secondary | ICD-10-CM | POA: Diagnosis not present

## 2023-10-28 DIAGNOSIS — S8012XA Contusion of left lower leg, initial encounter: Secondary | ICD-10-CM | POA: Insufficient documentation

## 2023-10-28 DIAGNOSIS — Z79899 Other long term (current) drug therapy: Secondary | ICD-10-CM | POA: Insufficient documentation

## 2023-10-28 DIAGNOSIS — M21941 Unspecified acquired deformity of hand, right hand: Secondary | ICD-10-CM | POA: Insufficient documentation

## 2023-10-28 DIAGNOSIS — D1779 Benign lipomatous neoplasm of other sites: Secondary | ICD-10-CM | POA: Diagnosis present

## 2023-10-28 DIAGNOSIS — R222 Localized swelling, mass and lump, trunk: Secondary | ICD-10-CM | POA: Insufficient documentation

## 2023-10-28 DIAGNOSIS — Y92410 Unspecified street and highway as the place of occurrence of the external cause: Secondary | ICD-10-CM | POA: Insufficient documentation

## 2023-10-28 DIAGNOSIS — S3992XA Unspecified injury of lower back, initial encounter: Secondary | ICD-10-CM | POA: Diagnosis present

## 2023-10-28 DIAGNOSIS — S39012A Strain of muscle, fascia and tendon of lower back, initial encounter: Secondary | ICD-10-CM | POA: Diagnosis not present

## 2023-10-28 LAB — I-STAT CHEM 8, ED
BUN: 18 mg/dL (ref 8–23)
Calcium, Ion: 1.08 mmol/L — ABNORMAL LOW (ref 1.15–1.40)
Chloride: 106 mmol/L (ref 98–111)
Creatinine, Ser: 1.4 mg/dL — ABNORMAL HIGH (ref 0.61–1.24)
Glucose, Bld: 96 mg/dL (ref 70–99)
HCT: 42 % (ref 39.0–52.0)
Hemoglobin: 14.3 g/dL (ref 13.0–17.0)
Potassium: 5.2 mmol/L — ABNORMAL HIGH (ref 3.5–5.1)
Sodium: 135 mmol/L (ref 135–145)
TCO2: 18 mmol/L — ABNORMAL LOW (ref 22–32)

## 2023-10-28 MED ORDER — NAPROXEN 500 MG PO TABS
500.0000 mg | ORAL_TABLET | Freq: Two times a day (BID) | ORAL | 0 refills | Status: DC
  Filled 2023-10-28: qty 30, 15d supply, fill #0

## 2023-10-28 MED ORDER — ALLOPURINOL 300 MG PO TABS
300.0000 mg | ORAL_TABLET | Freq: Every day | ORAL | 6 refills | Status: AC
  Filled 2023-10-28: qty 30, 30d supply, fill #0

## 2023-10-28 MED ORDER — NAPROXEN 500 MG PO TABS
500.0000 mg | ORAL_TABLET | Freq: Two times a day (BID) | ORAL | 0 refills | Status: AC
  Filled 2023-10-28 – 2023-10-29 (×2): qty 30, 15d supply, fill #0

## 2023-10-28 MED ORDER — BACITRACIN ZINC 500 UNIT/GM EX OINT
TOPICAL_OINTMENT | Freq: Two times a day (BID) | CUTANEOUS | Status: DC
  Filled 2023-10-28: qty 0.9

## 2023-10-28 MED ORDER — MUPIROCIN 2 % EX OINT: TOPICAL_OINTMENT | Freq: Two times a day (BID) | CUTANEOUS | 0 refills | Status: DC

## 2023-10-28 MED ORDER — NAPROXEN 250 MG PO TABS
500.0000 mg | ORAL_TABLET | Freq: Once | ORAL | Status: AC
  Administered 2023-10-28: 500 mg via ORAL
  Filled 2023-10-28: qty 2

## 2023-10-28 MED ORDER — CARVEDILOL 3.125 MG PO TABS
3.1250 mg | ORAL_TABLET | Freq: Two times a day (BID) | ORAL | 3 refills | Status: AC
  Filled 2023-10-28: qty 60, 30d supply, fill #0

## 2023-10-28 MED ORDER — MUPIROCIN 2 % EX OINT
TOPICAL_OINTMENT | Freq: Two times a day (BID) | CUTANEOUS | 0 refills | Status: AC
  Filled 2023-10-28: qty 22, fill #0
  Filled 2023-10-29: qty 22, 30d supply, fill #0

## 2023-10-28 MED ORDER — AMLODIPINE BESYLATE 10 MG PO TABS
10.0000 mg | ORAL_TABLET | Freq: Every day | ORAL | 6 refills | Status: AC
  Filled 2023-10-28: qty 30, 30d supply, fill #0
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
## 2023-10-28 NOTE — Progress Notes (Signed)
 Orthopedic Tech Progress Note Patient Details:  Keith Andrews 16-Jun-1955 [id number]  Patient ID: [name] [name] Shoulder, male   DOB: 17-Dec-1955, 68 y.o.   MRN: [id number] I attended trauma page. [name] 10/28/2023, 9:01 PM
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
## 2023-10-28 NOTE — Progress Notes (Signed)
 Subjective:  Patient ID: Keith Andrews, male    DOB: 02-Apr-1955  Age: 69 y.o. MRN: [id number]  CC: Hand Pain (Right arm pain and hand swelling./Knot on back)     Discussed the use of AI scribe software for clinical note transcription with the patient, who gave verbal consent to proceed.  History of Present Illness RAMIN ZOLL is a 68 year old male with hypertension and gout who presents with elevated blood pressure and joint pain. Last office visit was in 01/2021.  His blood pressure is elevated at 181/97 mmHg. He has not taken his antihypertensive medications, amlodipine  10 mg once daily and labetalol , due to running out of refills after moving. He experiences dizziness on some occasions but no other symptoms.  A friend accompanies him to this visit and attributes his elevated blood pressure due to stress from his difficult housing situation.  He experiences pain and swelling in his right arm, specifically in the elbow and hand.The pain is daily, with occasional relief. He has not received any pain medication for this condition in the past. He is also starting to notice presence of knots in his left hand and also mid back.  He smokes and denies consuming a lot of red meat.    Past Medical History:  Diagnosis Date   Hypertension     Past Surgical History:  Procedure Laterality Date   LAPAROTOMY N/A 12/31/2020   Procedure: EXPLORATORY LAPAROTOMY; SEGMENTAL SMALL BOWEL RESECTION;  Surgeon: Curvin Deward MOULD, MD;  Location: [facility];  Service: General;  Laterality: N/A;    No family history on file.  Social History   Socioeconomic History   Marital status: Single    Spouse name: Not on file   Number of children: Not on file   Years of education: Not on file   Highest education level: Not on file  Occupational History   Not on file  Tobacco Use   Smoking status: Every Day    Current packs/day: 0.50    Types: Cigarettes   Smokeless tobacco: Never  Substance and Sexual  Activity   Alcohol  use: Yes   Drug use: Never   Sexual activity: Not Currently  Other Topics Concern   Not on file  Social History Narrative   Not on file   Social Drivers of Health   Financial Resource Strain: Low Risk  (10/28/2023)   Overall Financial Resource Strain (CARDIA)    Difficulty of Paying Living Expenses: Not hard at all  Food Insecurity: Food Insecurity Present (10/28/2023)   Hunger Vital Sign    Worried About Running Out of Food in the Last Year: Often true    Ran Out of Food in the Last Year: Often true  Transportation Needs: Unmet Transportation Needs (10/28/2023)   PRAPARE - Administrator, Civil Service (Medical): Yes    Lack of Transportation (Non-Medical): Not on file  Physical Activity: Not on file  Stress: No Stress Concern Present (10/28/2023)   [employer] of Occupational Health - Occupational Stress Questionnaire    Feeling of Stress: Not at all  Social Connections: Socially Isolated (10/28/2023)   Social Connection and Isolation Panel    Frequency of Communication with Friends and Family: Twice a week    Frequency of Social Gatherings with Friends and Family: Once a week    Attends Religious Services: Never    Database administrator or Organizations: No    Attends Banker Meetings: Never    Marital  Status: Never married    No Known Allergies  Outpatient Medications Prior to Visit  Medication Sig Dispense Refill   acetaminophen  (TYLENOL ) 500 MG tablet Take 2 tablets (1,000 mg total) by mouth every 6 (six) hours as needed for mild pain or fever.     docusate sodium  (COLACE) 100 MG capsule Take 1 capsule (100 mg total) by mouth daily as needed for mild constipation. (Patient not taking: Reported on 02/07/2022)     methocarbamol  (ROBAXIN ) 500 MG tablet Take 2 tablets (1,000 mg total) by mouth every 8 (eight) hours as needed for muscle spasms. (Patient not taking: Reported on 02/07/2022) 60 tablet 0   naproxen  (NAPROSYN ) 500 MG  tablet Take 1 tablet (500 mg total) by mouth 2 (two) times daily with a meal. (Patient not taking: Reported on 02/07/2022) 60 tablet 1   pantoprazole  (PROTONIX ) 40 MG tablet Take 1 tablet (40 mg total) by mouth daily. 30 tablet 0   amLODipine  (NORVASC ) 10 MG tablet Take 1 tablet (10 mg total) by mouth daily. 30 tablet 6   cloNIDine  (CATAPRES ) 0.3 MG tablet Take 1 tablet (0.3 mg total) by mouth 2 (two) times daily. 60 tablet 1   labetalol  (NORMODYNE ) 100 MG tablet Take 1 tablet (100 mg total) by mouth 2 (two) times daily. 60 tablet 1   No facility-administered medications prior to visit.     ROS Review of Systems  Constitutional:  Negative for activity change and appetite change.  HENT:  Negative for sinus pressure and sore throat.   Respiratory:  Negative for chest tightness, shortness of breath and wheezing.   Cardiovascular:  Negative for chest pain and palpitations.  Gastrointestinal:  Negative for abdominal distention, abdominal pain and constipation.  Genitourinary: Negative.   Musculoskeletal: Negative.   Psychiatric/Behavioral:  Negative for behavioral problems and dysphoric mood.     Objective:  BP (!) 182/75   Pulse 64   Ht 5' 8 (1.727 m)   Wt 156 lb (70.8 kg)   SpO2 97%   BMI 23.72 kg/m      10/28/2023   10:52 AM 10/28/2023   10:07 AM 04/29/2023    3:44 AM  BP/Weight  Systolic BP 182 181 122  Diastolic BP 75 97 64  Wt. (Lbs)  156   BMI  23.72 kg/m2       Physical Exam Constitutional:      Appearance: He is well-developed.  Cardiovascular:     Rate and Rhythm: Normal rate.     Heart sounds: Normal heart sounds. No murmur heard. Pulmonary:     Effort: Pulmonary effort is normal.     Breath sounds: Normal breath sounds. No wheezing or rales.  Chest:     Chest wall: No tenderness.  Abdominal:     General: Bowel sounds are normal. There is no distension.     Palpations: Abdomen is soft. There is no mass.     Tenderness: There is no abdominal tenderness.   Musculoskeletal:        General: Normal range of motion.     Right lower leg: No edema.     Left lower leg: No edema.     Comments: Right olecranon swelling Swan neck deformity of fingers on right hand Tophi on ulnar border not of left hand Right posterior thorax with soft fluctuant mobile swelling  Neurological:     Mental Status: He is alert and oriented to person, place, and time.  Psychiatric:        Mood and  Affect: Mood normal.        Latest Ref Rng & Units 02/06/2022   12:09 AM 01/11/2021    9:45 AM 01/10/2021    3:25 AM  CMP  Glucose 70 - 99 mg/dL 894  814  872   BUN 8 - 23 mg/dL 15  24  15    Creatinine 0.61 - 1.24 mg/dL 9.10  8.89  8.87   Sodium 135 - 145 mmol/L 134  131  133   Potassium 3.5 - 5.1 mmol/L 4.0  4.7  4.5   Chloride 98 - 111 mmol/L 106  101  103   CO2 22 - 32 mmol/L 18  21  19    Calcium 8.9 - 10.3 mg/dL 9.4  9.0  8.9   Total Protein 6.5 - 8.1 g/dL 8.9     Total Bilirubin 0.3 - 1.2 mg/dL 0.4     Alkaline Phos 38 - 126 U/L 85     AST 15 - 41 U/L 34     ALT 0 - 44 U/L 25       Lipid Panel     Component Value Date/Time   TRIG 170 (H) 01/01/2021 0500    CBC    Component Value Date/Time   WBC 5.1 02/06/2022 0009   RBC 4.95 02/06/2022 0009   HGB 14.1 02/06/2022 0009   HGB 12.6 (L) 02/16/2021 1004   HCT 43.4 02/06/2022 0009   HCT 36.5 (L) 02/16/2021 1004   PLT 317 02/06/2022 0009   PLT 286 02/16/2021 1004   MCV 87.7 02/06/2022 0009   MCV 87 02/16/2021 1004   MCH 28.5 02/06/2022 0009   MCHC 32.5 02/06/2022 0009   RDW 14.7 02/06/2022 0009   RDW 12.4 02/16/2021 1004   LYMPHSABS 2.7 02/06/2022 0009   LYMPHSABS 3.0 02/16/2021 1004   MONOABS 0.4 02/06/2022 0009   EOSABS 0.1 02/06/2022 0009   EOSABS 0.1 02/16/2021 1004   BASOSABS 0.0 02/06/2022 0009   BASOSABS 0.0 02/16/2021 1004    No results found for: HGBA1C     Assessment & Plan Essential hypertension Hypertension poorly controlled at 181/97 mmHg due to missed refills. Stress  may contribute to elevated levels, increasing risk of complications. - Prescribe amlodipine  10 mg once daily. - Prescribe carvedilol  3.125 mg twice daily. - Instruct to have blood pressure rechecked by the nurse after rest. - Send prescriptions to the pharmacy on the ground floor. -Counseled on blood pressure goal of less than 130/80, low-sodium, DASH diet, medication compliance, 150 minutes of moderate intensity exercise per week. Discussed medication compliance, adverse effects.   Idiopathic gout of multiple sites with tophi /right hand deformity chronic gout with acute right elbow flare. Right hand deformity suggests possible rheumatoid arthritis. No recent uric acid or kidney function tests. - Order blood test for uric acid levels and rheumatoid arthritis assessment. - Prescribe allopurinol  for gout management. - Advise to reduce red meat intake. - Refer to hand surgeon for evaluation of hand deformity and pain.  Back mass, possible lipoma or cyst Soft back mass, possibly lipoma or cyst. No prior imaging conducted. - Order ultrasound of the back mass. - Arrange ultrasound appointment.   Encounter for SDOH screening Could explain elevated PHQ-9 scores Case manager called to assist with housing resources   Meds ordered this encounter  Medications   amLODipine  (NORVASC ) 10 MG tablet    Sig: Take 1 tablet (10 mg total) by mouth daily.    Dispense:  30 tablet    Refill:  6   carvedilol  (COREG ) 3.125 MG tablet    Sig: Take 1 tablet (3.125 mg total) by mouth 2 (two) times daily with a meal.    Dispense:  60 tablet    Refill:  3   allopurinol  (ZYLOPRIM ) 300 MG tablet    Sig: Take 1 tablet (300 mg total) by mouth daily.    Dispense:  30 tablet    Refill:  6    Follow-up: Return in about 1 month (around 11/27/2023) for Blood Pressure follow-up with PCP.       Corrina Sabin, MD, FAAFP. [facility] [place], [place] [phone]    10/28/2023, 12:11 PM

## 2023-10-28 NOTE — Discharge Instructions (Addendum)
 Your testing is normal, there is no broken bones, you do have an abrasion to the ankle that we will need to have a topical antibiotic, we have placed a topical antibiotic and a sterile dressing on it, do this twice a day, change the dressing twice a day, use the mupirocin  which I prescribed  Mupirocin  is a topical antibiotic that can be used to treat staph infections and help keep wounds clean.  Apply this twice a day to any open wounds or abrasions  Please take Naprosyn , 500mg  by mouth twice daily as needed for pain - this in an antiinflammatory medicine (NSAID) and is similar to ibuprofen  - many people feel that it is stronger than ibuprofen  and it is easier to take since it is a smaller pill.  Please use this only for 1 week - if your pain persists, you will need to follow up with your doctor in the office for ongoing guidance and pain control.  Thank you for allowing us  to treat you in the emergency department today.  After reviewing your examination and potential testing that was done it appears that you are safe to go home.  I would like for you to follow-up with your doctor within the next several days, have them obtain your records and follow-up with them to review all potential tests and results from your visit.  If you should develop severe or worsening symptoms return to the emergency department immediately

## 2023-10-28 NOTE — Telephone Encounter (Signed)
 I met with the patient when he was in the clinic today.  He was accompanied by his friend, Reyes Ghent.  The patient explained that he is in need of permanent housing. He has been staying with different friends.  When I asked if he has been at a shelter, he said he has to leave when his time is up.  Mr Ghent explained that they went to the [employer] and submitted an application for subsidized housing,   When I asked if he has been to the [facility], he became upset and said he is not going there, they won't help him and he said it is not safe there. He also said they have people sleeping under the bridge.   I explained that there is no instant solution for housing and the [facility] has case workers who deal specifically with housing needs/ resources. Mr Ghent said he has not taken the patient to the [facility].  I provided him with the address and phone number for the [facility] and encouraged them to go today.  Mr Ghent is able to provide transportation.   The patient's income is very limited- about $500/month. He said he should be getting more from [facility] since he is over 50 and he has been in contact with [employer].  I encouraged him to continue to work with [employer] to determine if he is eligible for any additional income as it will be very difficult to obtain housing with his very limited income    I also provided Mr Ghent with my phone number and encouraged him to call me with any questions/concerns. The patient gave me permission to speak to Mr Ghent about his housing needs/ community resources.

## 2023-10-28 NOTE — Patient Instructions (Signed)
 Gout  Gout is painful swelling of your joints. Gout is a type of arthritis. It is caused by having too much uric acid in your body. Uric acid is a chemical that is made when your body breaks down substances called purines. If your body has too much uric acid, sharp crystals can form and build up in your joints. This causes pain and swelling. Gout attacks can happen quickly and be very painful (acute gout). Over time, the attacks can affect more joints and happen more often (chronic gout). What are the causes? Gout is caused by too much uric acid in your blood. This can happen because: Your kidneys do not remove enough uric acid from your blood. Your body makes too much uric acid. You eat too many foods that are high in purines. These foods include organ meats, some seafood, and beer. Trauma or stress can bring on an attack. What increases the risk? Having a family history of gout. Being male and middle-aged. Being male and having gone through menopause. Having an organ transplant. Taking certain medicines. Having certain conditions, such as: Being very overweight (obese). Lead poisoning. Kidney disease. A skin condition called psoriasis. Other risks include: Losing weight too quickly. Not having enough water in the body (being dehydrated). Drinking alcohol, especially beer. Drinking beverages that are sweetened with a type of sugar called fructose. What are the signs or symptoms? An attack of acute gout often starts at night and usually happens in just one joint. The most common place is the big toe. Other joints that may be affected include joints of the feet, ankle, knee, fingers, wrist, or elbow. Symptoms may include: Very bad pain. Warmth. Swelling. Stiffness. Tenderness. The affected joint may be very painful to touch. Shiny, red, or purple skin. Chills and fever. Chronic gout may cause symptoms more often. More joints may be involved. You may also have white or yellow lumps  (tophi) on your hands or feet or in other areas near your joints. How is this treated? Treatment for an acute attack may include medicines for pain and swelling, such as: NSAIDs, such as ibuprofen. Steroids taken by mouth or injected into a joint. Colchicine. This can be given by mouth or through an IV tube. Treatment to prevent future attacks may include: Taking small doses of NSAIDs or colchicine daily. Using a medicine that reduces uric acid levels in your blood, such as allopurinol. Making changes to your diet. You may need to see a food expert (dietitian) about what to eat and drink to prevent gout. Follow these instructions at home: During a gout attack  If told, put ice on the painful area. To do this: Put ice in a plastic bag. Place a towel between your skin and the bag. Leave the ice on for 20 minutes, 2-3 times a day. Take off the ice if your skin turns bright red. This is very important. If you cannot feel pain, heat, or cold, you have a greater risk of damage to the area. Raise the painful joint above the level of your heart as often as you can. Rest the joint as much as possible. If the joint is in your leg, you may be given crutches. Follow instructions from your doctor about what you cannot eat or drink. Avoiding future gout attacks Eat a low-purine diet. Avoid foods and drinks such as: Liver. Kidney. Anchovies. Asparagus. Herring. Mushrooms. Mussels. Beer. Stay at a healthy weight. If you want to lose weight, talk with your doctor. Do not  lose weight too fast. Start or continue an exercise plan as told by your doctor. Eating and drinking Avoid drinks sweetened by fructose. Drink enough fluids to keep your pee (urine) pale yellow. If you drink alcohol: Limit how much you have to: 0-1 drink a day for women who are not pregnant. 0-2 drinks a day for men. Know how much alcohol is in a drink. In the [place] one drink equals one 12 oz bottle of beer (355 mL), one 5 oz  glass of wine (148 mL), or one 1 oz glass of hard liquor (44 mL). General instructions Take over-the-counter and prescription medicines only as told by your doctor. Ask your doctor if you should avoid driving or using machines while you are taking your medicine. Return to your normal activities when your doctor says that it is safe. Keep all follow-up visits. Where to find more information [employer]: www.niams.http://www.myers.net/ Contact a doctor if: You have another gout attack. You still have symptoms of a gout attack after 10 days of treatment. You have problems (side effects) because of your medicines. You have chills or a fever. You have burning pain when you pee (urinate). You have pain in your lower back or belly. Get help right away if: You have very bad pain. Your pain cannot be controlled. You cannot pee. Summary Gout is painful swelling of the joints. The most common site of pain is the big toe, but it can affect other joints. Medicines and avoiding some foods can help to prevent and treat gout attacks. This information is not intended to replace advice given to you by your health care provider. Make sure you discuss any questions you have with your health care provider. Document Revised: 11/09/2020 Document Reviewed: 11/09/2020 Elsevier Patient Education  2024 [employer].
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
## 2023-10-29 ENCOUNTER — Other Ambulatory Visit (HOSPITAL_COMMUNITY): Payer: Self-pay

## 2023-10-29 ENCOUNTER — Other Ambulatory Visit: Payer: Self-pay

## 2023-10-30 ENCOUNTER — Ambulatory Visit: Payer: Self-pay | Admitting: Family Medicine

## 2023-10-30 ENCOUNTER — Other Ambulatory Visit: Payer: Self-pay

## 2023-10-30 DIAGNOSIS — M069 Rheumatoid arthritis, unspecified: Secondary | ICD-10-CM

## 2023-10-30 DIAGNOSIS — L723 Sebaceous cyst: Secondary | ICD-10-CM

## 2023-10-30 LAB — CBC WITH DIFFERENTIAL/PLATELET
Basophils Absolute: 0 x10E3/uL (ref 0.0–0.2)
Basos: 1 %
EOS (ABSOLUTE): 0.1 x10E3/uL (ref 0.0–0.4)
Eos: 1 %
Hematocrit: 42.8 % (ref 37.5–51.0)
Hemoglobin: 14.2 g/dL (ref 13.0–17.7)
Immature Grans (Abs): 0 x10E3/uL (ref 0.0–0.1)
Immature Granulocytes: 0 %
Lymphocytes Absolute: 1.7 x10E3/uL (ref 0.7–3.1)
Lymphs: 41 %
MCH: 32.3 pg (ref 26.6–33.0)
MCHC: 33.2 g/dL (ref 31.5–35.7)
MCV: 97 fL (ref 79–97)
Monocytes Absolute: 0.5 x10E3/uL (ref 0.1–0.9)
Monocytes: 13 %
Neutrophils Absolute: 1.8 x10E3/uL (ref 1.4–7.0)
Neutrophils: 44 %
Platelets: 358 x10E3/uL (ref 150–450)
RBC: 4.4 x10E6/uL (ref 4.14–5.80)
RDW: 12.2 % (ref 11.6–15.4)
WBC: 4 x10E3/uL (ref 3.4–10.8)

## 2023-10-30 LAB — SEDIMENTATION RATE: Sed Rate: 27 mm/h (ref 0–30)

## 2023-10-30 LAB — URIC ACID: Uric Acid: 11.4 mg/dL — ABNORMAL HIGH (ref 3.8–8.4)

## 2023-10-30 LAB — FANA STAINING PATTERNS: Speckled Pattern: 1:1280 {titer} — ABNORMAL HIGH

## 2023-10-30 LAB — C-REACTIVE PROTEIN: CRP: <1 mg/L (ref 0–10)

## 2023-10-30 LAB — CMP14+EGFR
ALT: 22 IU/L (ref 0–44)
AST: 34 IU/L (ref 0–40)
Albumin: 4.3 g/dL (ref 3.9–4.9)
Alkaline Phosphatase: 103 IU/L (ref 44–121)
BUN/Creatinine Ratio: 15 (ref 10–24)
BUN: 17 mg/dL (ref 8–27)
Bilirubin Total: 0.5 mg/dL (ref 0.0–1.2)
Calcium: 10.2 mg/dL (ref 8.6–10.2)
Chloride: 102 mmol/L (ref 96–106)
Creatinine, Ser: 1.15 mg/dL (ref 0.76–1.27)
Globulin, Total: 4.1 g/dL (ref 1.5–4.5)
Glucose: 97 mg/dL (ref 70–99)
Potassium: 5.1 mmol/L (ref 3.5–5.2)
Sodium: 136 mmol/L (ref 134–144)
Total Protein: 8.4 g/dL (ref 6.0–8.5)
eGFR: 71 mL/min/1.73 (ref >59)

## 2023-10-30 LAB — ANA,IFA RA DIAG PNL W/RFLX TIT/PATN
ANA Titer 1: POSITIVE — AB
Cyclic Citrullin Peptide Ab: 20 U — ABNORMAL HIGH (ref 0–19)
Rheumatoid fact SerPl-aCnc: <10 [IU]/mL (ref <14.0)

## 2023-10-30 LAB — ANTI-SMITH ANTIBODY: ENA SM Ab Ser-aCnc: <0.2 AI (ref 0.0–0.9)

## 2023-10-30 LAB — ANTI-DNA ANTIBODY, DOUBLE-STRANDED: dsDNA Ab: 1 [IU]/mL (ref 0–9)

## 2023-10-30 MED ORDER — PREDNISONE 20 MG PO TABS
20.0000 mg | ORAL_TABLET | Freq: Every day | ORAL | 0 refills | Status: AC
  Filled 2023-10-30: qty 5, 5d supply, fill #0

## 2023-10-31 ENCOUNTER — Ambulatory Visit (HOSPITAL_COMMUNITY)
Admission: RE | Admit: 2023-10-31 | Discharge: 2023-10-31 | Disposition: A | Discharge status: Home / Self Care | Source: Ambulatory Visit | Attending: Family Medicine | Admitting: Family Medicine

## 2023-10-31 ENCOUNTER — Other Ambulatory Visit: Payer: Self-pay

## 2023-10-31 DIAGNOSIS — D1779 Benign lipomatous neoplasm of other sites: Secondary | ICD-10-CM | POA: Diagnosis present

## 2023-11-07 ENCOUNTER — Inpatient Hospital Stay: Admitting: Family Medicine

## 2023-11-11 ENCOUNTER — Other Ambulatory Visit: Payer: Self-pay

## 2023-11-14 ENCOUNTER — Ambulatory Visit
Admission: RE | Admit: 2023-11-14 | Discharge: 2023-11-14 | Disposition: A | Discharge status: Home / Self Care | Source: Ambulatory Visit | Attending: Adult Health Nurse Practitioner | Admitting: Adult Health Nurse Practitioner

## 2023-11-14 ENCOUNTER — Other Ambulatory Visit: Payer: Self-pay | Admitting: Adult Health Nurse Practitioner

## 2023-11-14 DIAGNOSIS — M25542 Pain in joints of left hand: Secondary | ICD-10-CM

## 2023-11-18 ENCOUNTER — Inpatient Hospital Stay: Admitting: Family Medicine

## 2023-11-21 ENCOUNTER — Telehealth: Payer: Self-pay | Admitting: Family Medicine

## 2023-11-21 NOTE — Telephone Encounter (Signed)
 Call placed to check on patient: [phone] and inquire if he has been working with [facility] and if he has been to the [facility].  Message left with cal back requested.

## 2023-11-21 NOTE — Telephone Encounter (Signed)
 Copied from CRM [id number]. Topic: Appointments - Appointment Info/Confirmation >> Nov 21, 2023 11:25 AM Tiffini S wrote:  Patient brother Reyes Ghent called stating that the patient was double booked for 11/28/23 with another appointment- asked to reschedule to 12/18/23- patient needs a appointment letter mailed out to his address at [address].

## 2023-11-21 NOTE — Telephone Encounter (Signed)
 Attempted to contact patient to inform Appointment letter has been mailed out.

## 2023-11-28 ENCOUNTER — Ambulatory Visit: Admitting: Family Medicine

## 2023-12-13 ENCOUNTER — Other Ambulatory Visit (HOSPITAL_COMMUNITY): Payer: Self-pay

## 2023-12-13 MED ORDER — ALLOPURINOL 300 MG PO TABS
300.0000 mg | ORAL_TABLET | Freq: Every day | ORAL | 3 refills | Status: AC
  Filled 2023-12-13: qty 90, 90d supply, fill #0

## 2023-12-13 MED ORDER — NAPROXEN 375 MG PO TABS
375.0000 mg | ORAL_TABLET | Freq: Two times a day (BID) | ORAL | 1 refills | Status: AC | PRN
  Filled 2023-12-13: qty 120, 60d supply, fill #0

## 2023-12-13 MED ORDER — AMLODIPINE BESYLATE 10 MG PO TABS
10.0000 mg | ORAL_TABLET | Freq: Every day | ORAL | 3 refills | Status: AC
  Filled 2023-12-13: qty 90, 90d supply, fill #0

## 2023-12-13 MED ORDER — CARVEDILOL 3.125 MG PO TABS
3.1250 mg | ORAL_TABLET | Freq: Every day | ORAL | 3 refills | Status: AC
  Filled 2023-12-13: qty 90, 90d supply, fill #0

## 2023-12-16 ENCOUNTER — Other Ambulatory Visit (HOSPITAL_COMMUNITY): Payer: Self-pay

## 2023-12-18 ENCOUNTER — Encounter: Payer: Self-pay | Admitting: Family Medicine

## 2023-12-18 ENCOUNTER — Other Ambulatory Visit: Payer: Self-pay

## 2023-12-18 ENCOUNTER — Ambulatory Visit: Attending: Family Medicine | Admitting: Family Medicine

## 2023-12-18 VITALS — BP 179/89 | HR 79 | Temp 97.9°F | Ht 68.0 in | Wt 160.0 lb

## 2023-12-18 DIAGNOSIS — Z5900 Homelessness unspecified: Secondary | ICD-10-CM | POA: Diagnosis not present

## 2023-12-18 DIAGNOSIS — Z91199 Patient's noncompliance with other medical treatment and regimen due to unspecified reason: Secondary | ICD-10-CM

## 2023-12-18 DIAGNOSIS — I10 Essential (primary) hypertension: Secondary | ICD-10-CM | POA: Diagnosis not present

## 2023-12-18 DIAGNOSIS — R42 Dizziness and giddiness: Secondary | ICD-10-CM

## 2023-12-18 NOTE — Progress Notes (Signed)
 Subjective:  Patient ID: Keith Andrews, male    DOB: [birth year]  Age: 68 y.o. MRN: [id number]  CC: Hypertension     Discussed the use of AI scribe software for clinical note transcription with the patient, who gave verbal consent to proceed.  History of Present Illness Keith Andrews is a 68 year old male with hypertension and gout who presents with dizziness.  He experiences dizziness characterized by a sensation of being 'somewhere else' and feeling as if he is 'falling'. This occurs primarily when sitting upright and turning his head, but not when lying down. Similar episodes have occurred in the past, especially with rapid head movements.  He has not taken his blood pressure medication for the past two days due to staying in a motel and lacking a stable place to live. He uses a bicycle for transportation and does not carry his medication regularly.    Past Medical History:  Diagnosis Date   Hypertension     Past Surgical History:  Procedure Laterality Date   LAPAROTOMY N/A 12/31/2020   Procedure: EXPLORATORY LAPAROTOMY; SEGMENTAL SMALL BOWEL RESECTION;  Surgeon: Curvin Deward MOULD, MD;  Location: [facility];  Service: General;  Laterality: N/A;    No family history on file.  Social History   Socioeconomic History   Marital status: Single    Spouse name: Not on file   Number of children: Not on file   Years of education: Not on file   Highest education level: Not on file  Occupational History   Not on file  Tobacco Use   Smoking status: Every Day    Current packs/day: 0.50    Types: Cigarettes   Smokeless tobacco: Never  Substance and Sexual Activity   Alcohol  use: Yes   Drug use: Never   Sexual activity: Not Currently  Other Topics Concern   Not on file  Social History Narrative   Not on file   Social Drivers of Health   Financial Resource Strain: High Risk (12/18/2023)   Overall Financial Resource Strain (CARDIA)    Difficulty of Paying Living Expenses: Very  hard  Food Insecurity: Food Insecurity Present (10/28/2023)   Hunger Vital Sign    Worried About Running Out of Food in the Last Year: Often true    Ran Out of Food in the Last Year: Often true  Transportation Needs: Unmet Transportation Needs (12/18/2023)   PRAPARE - Administrator, Civil Service (Medical): Yes    Lack of Transportation (Non-Medical): Yes  Physical Activity: Not on file  Stress: No Stress Concern Present (10/28/2023)   [employer] of Occupational Health - Occupational Stress Questionnaire    Feeling of Stress: Not at all  Social Connections: Socially Isolated (10/28/2023)   Social Connection and Isolation Panel    Frequency of Communication with Friends and Family: Twice a week    Frequency of Social Gatherings with Friends and Family: Once a week    Attends Religious Services: Never    Database Administrator or Organizations: No    Attends Engineer, Structural: Never    Marital Status: Never married    No Known Allergies  Outpatient Medications Prior to Visit  Medication Sig Dispense Refill   acetaminophen  (TYLENOL ) 500 MG tablet Take 2 tablets (1,000 mg total) by mouth every 6 (six) hours as needed for mild pain or fever.     allopurinol  (ZYLOPRIM ) 300 MG tablet Take 1 tablet (300 mg total) by mouth daily. 30  tablet 6   allopurinol  (ZYLOPRIM ) 300 MG tablet Take 1 tablet (300 mg total) by mouth daily for gout. 90 tablet 3   amLODipine  (NORVASC ) 10 MG tablet Take 1 tablet (10 mg total) by mouth daily for high blood pressure management. 90 tablet 3   carvedilol  (COREG ) 3.125 MG tablet Take 1 tablet (3.125 mg total) by mouth daily for blood pressure management. 90 tablet 3   docusate sodium  (COLACE) 100 MG capsule Take 1 capsule (100 mg total) by mouth daily as needed for mild constipation.     amLODipine  (NORVASC ) 10 MG tablet Take 1 tablet (10 mg total) by mouth daily. 30 tablet 6   carvedilol  (COREG ) 3.125 MG tablet Take 1 tablet (3.125 mg  total) by mouth 2 (two) times daily with a meal. 60 tablet 3   methocarbamol  (ROBAXIN ) 500 MG tablet Take 2 tablets (1,000 mg total) by mouth every 8 (eight) hours as needed for muscle spasms. (Patient not taking: Reported on 12/18/2023) 60 tablet 0   mupirocin  ointment (BACTROBAN ) 2 % Apply topically in the morning and at bedtime. (Patient not taking: Reported on 12/18/2023) 22 g 0   naproxen  (NAPROSYN ) 375 MG tablet Take 1 tablet (375 mg total) by mouth every 12 (twelve) hours as needed for pain. (Patient not taking: Reported on 12/18/2023) 120 tablet 1   naproxen  (NAPROSYN ) 500 MG tablet Take 1 tablet (500 mg total) by mouth 2 (two) times daily with a meal. (Patient not taking: Reported on 12/18/2023) 30 tablet 0   pantoprazole  (PROTONIX ) 40 MG tablet Take 1 tablet (40 mg total) by mouth daily. (Patient not taking: Reported on 12/18/2023) 30 tablet 0   predniSONE  (DELTASONE ) 20 MG tablet Take 1 tablet (20 mg total) by mouth daily with breakfast. (Patient not taking: Reported on 12/18/2023) 5 tablet 0   No facility-administered medications prior to visit.     ROS Review of Systems  Constitutional:  Negative for activity change and appetite change.  HENT:  Negative for sinus pressure and sore throat.   Respiratory:  Negative for chest tightness, shortness of breath and wheezing.   Cardiovascular:  Negative for chest pain and palpitations.  Gastrointestinal:  Negative for abdominal distention, abdominal pain and constipation.  Genitourinary: Negative.   Musculoskeletal: Negative.   Neurological:  Positive for dizziness.  Psychiatric/Behavioral:  Negative for behavioral problems and dysphoric mood.     Objective:  BP (!) 179/89   Pulse 79   Temp 97.9 F (36.6 C) (Oral)   Ht 5' 8 (1.727 m)   Wt 160 lb (72.6 kg)   SpO2 97%   BMI 24.33 kg/m      12/18/2023   11:13 AM 12/18/2023   10:53 AM 10/28/2023   10:50 PM  BP/Weight  Systolic BP 179 151 180  Diastolic BP 89 80 159  Wt.  (Lbs)  160   BMI  24.33 kg/m2       Physical Exam Constitutional:      Appearance: He is well-developed.  Cardiovascular:     Rate and Rhythm: Normal rate.     Heart sounds: Normal heart sounds. No murmur heard. Pulmonary:     Effort: Pulmonary effort is normal.     Breath sounds: Normal breath sounds. No wheezing or rales.  Chest:     Chest wall: No tenderness.  Abdominal:     General: Bowel sounds are normal. There is no distension.     Palpations: Abdomen is soft. There is no mass.  Tenderness: There is no abdominal tenderness.  Musculoskeletal:        General: Normal range of motion.     Right lower leg: No edema.     Left lower leg: No edema.  Neurological:     Mental Status: He is alert and oriented to person, place, and time.  Psychiatric:        Mood and Affect: Mood normal.        Latest Ref Rng & Units 10/28/2023    9:02 PM 10/28/2023   11:04 AM 02/06/2022   12:09 AM  CMP  Glucose 70 - 99 mg/dL 96  97  894   BUN 8 - 23 mg/dL 18  17  15    Creatinine 0.61 - 1.24 mg/dL 8.59  8.84  9.10   Sodium 135 - 145 mmol/L 135  136  134   Potassium 3.5 - 5.1 mmol/L 5.2  5.1  4.0   Chloride 98 - 111 mmol/L 106  102  106   CO2 mmol/L  CANCELED  18   Calcium 8.6 - 10.2 mg/dL  89.7  9.4   Total Protein 6.0 - 8.5 g/dL  8.4  8.9   Total Bilirubin 0.0 - 1.2 mg/dL  0.5  0.4   Alkaline Phos 44 - 121 IU/L  103  85   AST 0 - 40 IU/L  34  34   ALT 0 - 44 IU/L  22  25     Lipid Panel     Component Value Date/Time   TRIG 170 (H) 01/01/2021 0500    CBC    Component Value Date/Time   WBC 4.0 10/28/2023 1104   WBC 5.1 02/06/2022 0009   RBC 4.40 10/28/2023 1104   RBC 4.95 02/06/2022 0009   HGB 14.3 10/28/2023 2102   HGB 14.2 10/28/2023 1104   HCT 42.0 10/28/2023 2102   HCT 42.8 10/28/2023 1104   PLT 358 10/28/2023 1104   MCV 97 10/28/2023 1104   MCH 32.3 10/28/2023 1104   MCH 28.5 02/06/2022 0009   MCHC 33.2 10/28/2023 1104   MCHC 32.5 02/06/2022 0009   RDW 12.2  10/28/2023 1104   LYMPHSABS 1.7 10/28/2023 1104   MONOABS 0.4 02/06/2022 0009   EOSABS 0.1 10/28/2023 1104   BASOSABS 0.0 10/28/2023 1104    No results found for: HGBA1C     Assessment & Plan Essential hypertension/nonadherence with medical treatment Blood pressure elevated due to non-compliance with medication, increased stroke risk. - Advise use of pill box for medication organization. - Discuss keeping medications in pill box jacket pocket for access since he is homeless and frequently moves around. - Schedule follow-up in six weeks after consistent medication use to evaluate blood pressure.  Vertigo Intermittent vertigo likely due to vestibular dysfunction. Hypotension and anemia ruled out. - Refer to vestibular rehabilitation for exercises. - Advise avoiding rapid head movements and change positions slowly.   Homelessness - Arrange case manager assistance for resources and housing.    No orders of the defined types were placed in this encounter.   Follow-up: Return in about 1 week (around 12/25/2023) for Blood pressure follow-up with Herlene.       Corrina Sabin, MD, FAAFP. [facility] [place], [place] [phone]   12/18/2023, 1:07 PM

## 2023-12-18 NOTE — Telephone Encounter (Signed)
 I met with the patient and his friend, Keith Andrews they were in the clinic today.  The patient is still in search of housing. He has refused to stay at [facility] and is not open to checking availability of shelters in [facility].  Keith Andrews explained that they contacted [employer] about his disability income and they were informed that he is eligible for an income adjustment that would possibly bring his monthly income up to $700-800. They are still waiting on a final determination.   In the meantime, I provided them with a listing of properties available in [place] as well as those properties with wait lists with rent no more than $600/month. The list was obtained from [email or web address]    Keith Andrews has a vehicle and is able to take the patient to view the properties and  Keith Andrews said he will start making calls to the properties today.   They explained that they already have registered him with the [employer] and they have been to the [facility].  They stated that they also went to [employer] but were informed that the organization only helps with employment, not housing.    I told him that I can refer him to Aloysius Ford, RN/Congregational Nurse to see if he is eligible for any other housing resources.  He said that he thinks he spoke to her already but I told him that I will check with her again and ask her to contact Keith Andrews and both the patient and Keith Andrews were in agreement.

## 2023-12-18 NOTE — Patient Instructions (Signed)
 VISIT SUMMARY:  Today, you were seen for dizziness and your history of high blood pressure. You mentioned that your dizziness feels like you are 'somewhere else' and as if you are 'falling', especially when sitting upright and turning your head. You also shared that you have not taken your blood pressure medication for the past two days due to your current living situation.  YOUR PLAN:  -ESSENTIAL HYPERTENSION: Essential hypertension means high blood pressure without a known secondary cause. Your blood pressure is elevated because you have not been taking your medication regularly, which increases your risk of stroke. We discussed using a pill box to help organize your medication and keeping your medications in your jacket pocket for easy access. We will also arrange for a case manager to assist you with resources and housing. Please schedule a follow-up appointment in six weeks after you have been consistently taking your medication.  -VERTIGO: Vertigo is a sensation of spinning or dizziness, often due to problems with the inner ear. Your vertigo is likely due to vestibular dysfunction. We have ruled out low blood pressure and anemia as causes. You will be referred to vestibular rehabilitation for exercises to help manage your symptoms. In the meantime, avoid rapid head movements and change positions slowly.  INSTRUCTIONS:  Please schedule a follow-up appointment in six weeks to review your blood pressure and overall progress. In the meantime, ensure you take your blood pressure medication regularly and follow the advice given for managing your vertigo.

## 2023-12-29 NOTE — Progress Notes (Unsigned)
   S:     No chief complaint on file.  68 y.o. male who presents for hypertension evaluation, education, and management. PMH is significant for ***. Patient was referred and last seen by Primary Care Provider, Dr. Delbert, on 12/18/23. At last visit, patient's BP was elevated in clinic. He reported experiencing dizziness when sitting upright and turning his head. Reported not taking medications due to being in a motel and unstable place to live. He uses his bicycle as transportation and does not carry medication regularly. Dr. Newlin recommended using a pill organizer that can be placed in patient's jacket pocket.  Today, patient arrives in *** spirits and presents without *** assistance. *** Denies dizziness, headache, blurred vision, swelling.   Able to take medications? Side effects? Headaches? Dizziness? Swelling? No thiazides - risk of increasing UA ARB: could be a good option, but K is high Hydralazine  could be fine - patient likely not to take it as directed  Family/Social history: Social hx: homeless  Insurance coverage: [employer] Medicare and [place] [employer]  Medication adherence *** . Patient reports *** taking blood pressure medications today.   Current antihypertensives include: amlodipine  10 mg once daily, carvedilol  6.25 mg BID with meals Antihypertensives tried in the past include: ***  Reported home blood pressure readings: ***  Patient reported dietary habits: Eats *** meals/day Breakfast: *** Lunch: *** Dinner: *** Snacks: *** Drinks: ***  Patient-reported exercise habits: ***  ASCVD risk factors include: ***  O:  ROS  Physical Exam  Last 3 Office BP readings: BP Readings from Last 3 Encounters:  12/18/23 (!) 179/89  10/28/23 (!) 180/159  10/28/23 (!) 182/75    BMET    Component Value Date/Time   NA 135 10/28/2023 2102   NA 136 10/28/2023 1104   K 5.2 (H) 10/28/2023 2102   CL 106 10/28/2023 2102   CO2 CANCELED 10/28/2023 1104   GLUCOSE 96 10/28/2023  2102   BUN 18 10/28/2023 2102   BUN 17 10/28/2023 1104   CREATININE 1.40 (H) 10/28/2023 2102   CALCIUM 10.2 10/28/2023 1104   GFRNONAA >60 02/06/2022 0009   GFRAA >60 08/22/2014 2350    Renal function: CrCl cannot be calculated (Patient's most recent lab result is older than the maximum 21 days allowed.).  Clinical ASCVD: {YES/NO:21197} The ASCVD Risk score (Arnett DK, et al., 2019) failed to calculate for the following reasons:   Cannot find a previous HDL lab   Cannot find a previous total cholesterol lab  Patient is participating in a Managed Medicaid Plan:  {MM YES/NO:27447::Yes}    A/P: Hypertension diagnosed *** currently *** on current medications. BP goal < 130/80 *** mmHg. Medication adherence appears ***. Control is suboptimal due to ***.  -{Meds adjust:18428} ***.  -{Meds adjust:18428} ***.  -Patient educated on purpose, proper use, and potential adverse effects of ***.  -F/u labs ordered - *** -Counseled on lifestyle modifications for blood pressure control including reduced dietary sodium, increased exercise, adequate sleep. -Encouraged patient to check BP at home and bring log of readings to next visit. Counseled on proper use of home BP cuff.   Results reviewed and written information provided.    Written patient instructions provided. Patient verbalized understanding of treatment plan.  Total time in face to face counseling *** minutes.    Follow-up:  Pharmacist ***. PCP clinic visit not scheduled yet  Jenkins Graces, PharmD PGY1 Pharmacy Resident [phone]

## 2023-12-30 ENCOUNTER — Ambulatory Visit: Admitting: Pharmacist

## 2023-12-31 ENCOUNTER — Other Ambulatory Visit: Payer: Self-pay | Admitting: Adult Health Nurse Practitioner

## 2023-12-31 DIAGNOSIS — F17210 Nicotine dependence, cigarettes, uncomplicated: Secondary | ICD-10-CM

## 2024-01-09 ENCOUNTER — Encounter

## 2024-01-09 NOTE — Progress Notes (Deleted)
 Office Visit Note  Patient: Keith Andrews             Date of Birth: May 03, 1955           MRN: [id number]             PCP: Delbert Clam, MD Referring: Delbert Clam, MD Visit Date: 01/09/2024 Occupation: @GUAROCC @  Subjective:  No chief complaint on file.    History of Present Illness: Keith Andrews is a 68 y.o. male ***   Activities of Daily Living:  Patient reports morning stiffness for *** {minute/hour:19697}.   Patient {ACTIONS;DENIES/REPORTS:21021675::Denies} nocturnal pain.  Difficulty dressing/grooming: {ACTIONS;DENIES/REPORTS:21021675::Denies} Difficulty climbing stairs: {ACTIONS;DENIES/REPORTS:21021675::Denies} Difficulty getting out of chair: {ACTIONS;DENIES/REPORTS:21021675::Denies} Difficulty using hands for taps, buttons, cutlery, and/or writing: {ACTIONS;DENIES/REPORTS:21021675::Denies}  No Rheumatology ROS completed.    Rheum History: # Diagnosed in ***.  Manifestation of disease:   Serologies: (+) *** (-) ***  Maintenance Labs: QuantiFERON: *** Hepatitis panel: ***  Current Treatment ***  Prior Treatments ***   PMFS History:  Patient Active Problem List   Diagnosis Date Noted   Agitation 02/06/2022   Hypertension    Sepsis (HCC) 12/31/2020   Small bowel perforation (HCC) 12/31/2020   Gout attack 09/21/2019    Past Medical History:  Diagnosis Date   Hypertension     No family history on file. Past Surgical History:  Procedure Laterality Date   LAPAROTOMY N/A 12/31/2020   Procedure: EXPLORATORY LAPAROTOMY; SEGMENTAL SMALL BOWEL RESECTION;  Surgeon: Curvin Deward MOULD, MD;  Location: [facility];  Service: General;  Laterality: N/A;   Social History   Social History Narrative   Not on file   Immunization History  Administered Date(s) Administered   PFIZER(Purple Top)SARS-COV-2 Vaccination 06/27/2019, 07/18/2019   Tdap 08/13/2018, 04/29/2023     Objective: Vital Signs: There were no vitals taken for this visit.    Physical Exam Vitals and nursing note reviewed.  HENT:     Head: Normocephalic and atraumatic.     Nose: Nose normal.  Eyes:     Conjunctiva/sclera: Conjunctivae normal.     Pupils: Pupils are equal, round, and reactive to light.  Cardiovascular:     Rate and Rhythm: Normal rate and regular rhythm.     Heart sounds: Normal heart sounds.  Pulmonary:     Effort: Pulmonary effort is normal.     Breath sounds: Normal breath sounds.  Skin:    General: Skin is warm and dry.  Neurological:     Mental Status: He is alert. Mental status is at baseline.  Psychiatric:        Mood and Affect: Mood normal.        Behavior: Behavior normal.      Musculoskeletal Exam: ***  CDAI Exam: CDAI Score: -- Patient Global: --; Provider Global: -- Swollen: --; Tender: -- Joint Exam 01/09/2024   No joint exam has been documented for this visit   There is currently no information documented on the homunculus. Go to the Rheumatology activity and complete the homunculus joint exam.  Investigation: No additional findings.  Imaging: No results found.  Recent Labs: Lab Results  Component Value Date   WBC 4.0 10/28/2023   HGB 14.3 10/28/2023   PLT 358 10/28/2023   NA 135 10/28/2023   K 5.2 (H) 10/28/2023   CL 106 10/28/2023   CO2 CANCELED 10/28/2023   GLUCOSE 96 10/28/2023   BUN 18 10/28/2023   CREATININE 1.40 (H) 10/28/2023   BILITOT 0.5 10/28/2023   ALKPHOS 103  10/28/2023   AST 34 10/28/2023   ALT 22 10/28/2023   PROT 8.4 10/28/2023   ALBUMIN 4.3 10/28/2023   CALCIUM 10.2 10/28/2023   GFRAA >60 08/22/2014   Lab Results  Component Value Date   RF <10.0 10/28/2023    Speciality Comments: No specialty comments available.  Procedures:  No procedures performed Allergies: Patient has no known allergies.   Assessment / Plan:     Visit Diagnoses: No diagnosis found.  #High risk medication use  Orders: No orders of the defined types were placed in this encounter.  No  orders of the defined types were placed in this encounter.   I personally spent a total of *** minutes in the care of the patient today including {Time Based Coding:210964241}.  Follow-Up Instructions: No follow-ups on file.   [name], DO

## 2024-01-21 ENCOUNTER — Telehealth: Payer: Self-pay

## 2024-01-21 NOTE — Telephone Encounter (Signed)
 Left message with client as well as emergency contact to call RN in reference to forwarded message from Slater Diesel, RN.
